# Patient Record
Sex: Male | Born: 1965 | Race: White | Hispanic: No | Marital: Married | Smoking: Never smoker
Health system: Southern US, Community
[De-identification: ages and names within clinical notes are randomized; demographics above are authoritative.]

## PROBLEM LIST (undated history)

## (undated) DIAGNOSIS — D689 Coagulation defect, unspecified: Secondary | ICD-10-CM

## (undated) DIAGNOSIS — Z86718 Personal history of other venous thrombosis and embolism: Secondary | ICD-10-CM

## (undated) HISTORY — PX: FRACTURE SURGERY: SHX138

## (undated) HISTORY — PX: LEG SURGERY: SHX1003

## (undated) HISTORY — DX: Coagulation defect, unspecified: D68.9

---

## 1999-01-13 ENCOUNTER — Encounter: Payer: Self-pay | Admitting: Oral Surgery

## 1999-01-17 ENCOUNTER — Ambulatory Visit (HOSPITAL_COMMUNITY): Admission: RE | Admit: 1999-01-17 | Discharge: 1999-01-17 | Payer: Self-pay | Admitting: Oral Surgery

## 2010-05-15 ENCOUNTER — Other Ambulatory Visit: Payer: Self-pay | Admitting: Orthopedic Surgery

## 2010-05-15 ENCOUNTER — Inpatient Hospital Stay (HOSPITAL_COMMUNITY)
Admission: EM | Admit: 2010-05-15 | Discharge: 2010-05-16 | Payer: Self-pay | Source: Home / Self Care | Attending: Internal Medicine | Admitting: Internal Medicine

## 2010-05-15 ENCOUNTER — Ambulatory Visit (HOSPITAL_COMMUNITY)
Admission: RE | Admit: 2010-05-15 | Discharge: 2010-05-15 | Payer: Self-pay | Source: Home / Self Care | Attending: Unknown Physician Specialty | Admitting: Unknown Physician Specialty

## 2010-07-24 LAB — BASIC METABOLIC PANEL
BUN: 16 mg/dL (ref 6–23)
BUN: 20 mg/dL (ref 6–23)
CO2: 28 mEq/L (ref 19–32)
CO2: 29 mEq/L (ref 19–32)
Calcium: 10 mg/dL (ref 8.4–10.5)
Calcium: 9.1 mg/dL (ref 8.4–10.5)
Chloride: 100 mEq/L (ref 96–112)
Chloride: 100 mEq/L (ref 96–112)
Creatinine, Ser: 0.98 mg/dL (ref 0.4–1.5)
Creatinine, Ser: 1.13 mg/dL (ref 0.4–1.5)
GFR calc Af Amer: >60 mL/min (ref >60)
GFR calc Af Amer: >60 mL/min (ref >60)
GFR calc non Af Amer: >60 mL/min (ref >60)
GFR calc non Af Amer: >60 mL/min (ref >60)
Glucose, Bld: 135 mg/dL — ABNORMAL HIGH (ref 70–99)
Glucose, Bld: 99 mg/dL (ref 70–99)
Potassium: 4.4 mEq/L (ref 3.5–5.1)
Potassium: 4.6 mEq/L (ref 3.5–5.1)
Sodium: 134 mEq/L — ABNORMAL LOW (ref 135–145)
Sodium: 139 mEq/L (ref 135–145)

## 2010-07-24 LAB — CBC
HCT: 38.1 % — ABNORMAL LOW (ref 39.0–52.0)
HCT: 39 % (ref 39.0–52.0)
HCT: 42.7 % (ref 39.0–52.0)
Hemoglobin: 12.5 g/dL — ABNORMAL LOW (ref 13.0–17.0)
Hemoglobin: 13 g/dL (ref 13.0–17.0)
Hemoglobin: 14 g/dL (ref 13.0–17.0)
MCH: 31.2 pg (ref 26.0–34.0)
MCH: 31.3 pg (ref 26.0–34.0)
MCH: 31.9 pg (ref 26.0–34.0)
MCHC: 32.8 g/dL (ref 30.0–36.0)
MCHC: 32.8 g/dL (ref 30.0–36.0)
MCHC: 33.3 g/dL (ref 30.0–36.0)
MCV: 95 fL (ref 78.0–100.0)
MCV: 95.5 fL (ref 78.0–100.0)
MCV: 95.6 fL (ref 78.0–100.0)
Platelets: 253 10*3/uL (ref 150–400)
Platelets: 292 10*3/uL (ref 150–400)
Platelets: 308 10*3/uL (ref 150–400)
RBC: 4.01 MIL/uL — ABNORMAL LOW (ref 4.22–5.81)
RBC: 4.08 MIL/uL — ABNORMAL LOW (ref 4.22–5.81)
RBC: 4.47 MIL/uL (ref 4.22–5.81)
RDW: 12.1 % (ref 11.5–15.5)
RDW: 12.2 % (ref 11.5–15.5)
RDW: 12.2 % (ref 11.5–15.5)
WBC: 10.4 10*3/uL (ref 4.0–10.5)
WBC: 10.8 10*3/uL — ABNORMAL HIGH (ref 4.0–10.5)
WBC: 9.3 10*3/uL (ref 4.0–10.5)

## 2010-07-24 LAB — LUPUS ANTICOAGULANT PANEL
DRVVT: 51.5 secs — ABNORMAL HIGH (ref 36.2–44.3)
Lupus Anticoagulant: NOT DETECTED
PTT Lupus Anticoagulant: 35.8 secs (ref 30.0–45.6)
dRVVT Incubated 1:1 Mix: 42.8 secs (ref 36.2–44.3)

## 2010-07-24 LAB — PROTIME-INR
INR: 0.91 (ref 0.00–1.49)
INR: 1.03 (ref 0.00–1.49)
Prothrombin Time: 12.5 seconds (ref 11.6–15.2)
Prothrombin Time: 13.7 seconds (ref 11.6–15.2)

## 2010-07-24 LAB — BETA-2-GLYCOPROTEIN I ABS, IGG/M/A
Beta-2 Glyco I IgG: 1 G Units (ref <20)
Beta-2-Glycoprotein I IgA: 7 A Units (ref <20)
Beta-2-Glycoprotein I IgM: 7 M Units (ref <20)

## 2010-07-24 LAB — PROTEIN S ACTIVITY: Protein S Activity: 125 % (ref 69–129)

## 2010-07-24 LAB — FACTOR 5 LEIDEN

## 2010-07-24 LAB — CARDIOLIPIN ANTIBODIES, IGG, IGM, IGA
Anticardiolipin IgA: 5 APL U/mL — ABNORMAL LOW (ref <22)
Anticardiolipin IgG: 6 GPL U/mL — ABNORMAL LOW (ref <23)
Anticardiolipin IgM: 4 MPL U/mL — ABNORMAL LOW (ref <11)

## 2010-07-24 LAB — PROTEIN C ACTIVITY: Protein C Activity: 117 % (ref 75–133)

## 2010-07-24 LAB — ANTITHROMBIN III: AntiThromb III Func: 113 % (ref 76–126)

## 2010-07-24 LAB — PROTEIN S, TOTAL: Protein S Ag, Total: 161 % — ABNORMAL HIGH (ref 70–140)

## 2010-07-24 LAB — HOMOCYSTEINE: Homocysteine: 10.5 umol/L (ref 4.0–15.4)

## 2010-07-24 LAB — PROTEIN C, TOTAL: Protein C, Total: 112 % (ref 70–140)

## 2010-07-24 LAB — PROTHROMBIN GENE MUTATION

## 2011-07-02 ENCOUNTER — Emergency Department (INDEPENDENT_AMBULATORY_CARE_PROVIDER_SITE_OTHER): Payer: No Typology Code available for payment source

## 2011-07-02 ENCOUNTER — Encounter (HOSPITAL_BASED_OUTPATIENT_CLINIC_OR_DEPARTMENT_OTHER): Payer: Self-pay | Admitting: *Deleted

## 2011-07-02 ENCOUNTER — Other Ambulatory Visit: Payer: Self-pay

## 2011-07-02 ENCOUNTER — Emergency Department (HOSPITAL_BASED_OUTPATIENT_CLINIC_OR_DEPARTMENT_OTHER)
Admission: EM | Admit: 2011-07-02 | Discharge: 2011-07-03 | Disposition: A | Discharge status: Home Health | Payer: No Typology Code available for payment source | Attending: Emergency Medicine | Admitting: Emergency Medicine

## 2011-07-02 DIAGNOSIS — R079 Chest pain, unspecified: Secondary | ICD-10-CM

## 2011-07-02 DIAGNOSIS — R071 Chest pain on breathing: Secondary | ICD-10-CM | POA: Insufficient documentation

## 2011-07-02 DIAGNOSIS — R0789 Other chest pain: Secondary | ICD-10-CM

## 2011-07-02 HISTORY — DX: Personal history of other venous thrombosis and embolism: Z86.718

## 2011-07-02 LAB — COMPREHENSIVE METABOLIC PANEL
ALT: 17 U/L (ref 0–53)
AST: 20 U/L (ref 0–37)
Albumin: 4.3 g/dL (ref 3.5–5.2)
Alkaline Phosphatase: 65 U/L (ref 39–117)
BUN: 20 mg/dL (ref 6–23)
CO2: 27 mEq/L (ref 19–32)
Calcium: 10.1 mg/dL (ref 8.4–10.5)
Chloride: 100 mEq/L (ref 96–112)
Creatinine, Ser: 1.2 mg/dL (ref 0.50–1.35)
GFR calc Af Amer: 82 mL/min — ABNORMAL LOW (ref >90)
GFR calc non Af Amer: 71 mL/min — ABNORMAL LOW (ref >90)
Glucose, Bld: 94 mg/dL (ref 70–99)
Potassium: 3.8 mEq/L (ref 3.5–5.1)
Sodium: 137 mEq/L (ref 135–145)
Total Bilirubin: 0.3 mg/dL (ref 0.3–1.2)
Total Protein: 7.5 g/dL (ref 6.0–8.3)

## 2011-07-02 LAB — DIFFERENTIAL
Basophils Absolute: 0.1 10*3/uL (ref 0.0–0.1)
Basophils Relative: 0 % (ref 0–1)
Eosinophils Absolute: 0.1 10*3/uL (ref 0.0–0.7)
Eosinophils Relative: 1 % (ref 0–5)
Lymphocytes Relative: 43 % (ref 12–46)
Lymphs Abs: 5.2 10*3/uL — ABNORMAL HIGH (ref 0.7–4.0)
Monocytes Absolute: 0.8 10*3/uL (ref 0.1–1.0)
Monocytes Relative: 6 % (ref 3–12)
Neutro Abs: 6.1 10*3/uL (ref 1.7–7.7)
Neutrophils Relative %: 50 % (ref 43–77)

## 2011-07-02 LAB — CBC
HCT: 43.3 % (ref 39.0–52.0)
Hemoglobin: 14.9 g/dL (ref 13.0–17.0)
MCH: 31.7 pg (ref 26.0–34.0)
MCHC: 34.4 g/dL (ref 30.0–36.0)
MCV: 92.1 fL (ref 78.0–100.0)
Platelets: 245 10*3/uL (ref 150–400)
RBC: 4.7 MIL/uL (ref 4.22–5.81)
RDW: 11.5 % (ref 11.5–15.5)
WBC: 12.3 10*3/uL — ABNORMAL HIGH (ref 4.0–10.5)

## 2011-07-02 LAB — CARDIAC PANEL(CRET KIN+CKTOT+MB+TROPI)
CK, MB: 2.1 ng/mL (ref 0.3–4.0)
Relative Index: 1.4 (ref 0.0–2.5)
Total CK: 151 U/L (ref 7–232)
Troponin I: <0.3 ng/mL (ref <0.30)

## 2011-07-02 LAB — LIPASE, BLOOD: Lipase: 43 U/L (ref 11–59)

## 2011-07-02 MED ORDER — GI COCKTAIL ~~LOC~~
30.0000 mL | Freq: Once | ORAL | Status: AC
  Administered 2011-07-02: 30 mL via ORAL
  Filled 2011-07-02: qty 30

## 2011-07-02 MED ORDER — SODIUM CHLORIDE 0.9 % IV BOLUS (SEPSIS)
1000.0000 mL | Freq: Once | INTRAVENOUS | Status: AC
  Administered 2011-07-02: 1000 mL via INTRAVENOUS

## 2011-07-02 NOTE — ED Provider Notes (Addendum)
History   This chart was scribed for Jason Browning [name], MD by Jason Browning. The patient was seen in room MH02/MH02 and the patient's care was started at 11:03PM.    CSN: [id number]  Arrival date & time 07/02/11  2225   First MD Initiated Contact with Patient 07/02/11 2301      Chief Complaint  Patient presents with  . Chest Pain    (Consider location/radiation/quality/duration/timing/severity/associated sxs/prior treatment) Patient is a 46 y.o. male presenting with chest pain. The history is provided by the patient. No language interpreter was used.  Chest Pain The chest pain began yesterday. Chest pain occurs constantly. The chest pain is unchanged. Associated with: none. At its most intense, the pain is at 3/10. The pain is currently at 2/10. The severity of the pain is mild. The quality of the pain is described as dull. The pain does not radiate. Pertinent negatives for primary symptoms include no fever, no fatigue, no syncope, no shortness of breath, no cough, no wheezing, no palpitations, no abdominal pain, no nausea, no vomiting and no altered mental status.  Pertinent negatives for associated symptoms include no claudication, no diaphoresis and no lower extremity edema. He tried nothing for the symptoms.  Pertinent negatives for past medical history include no aneurysm and no Marfan's syndrome.  Pertinent negatives for family medical history include: family history of aortic dissection and no CAD in family.  Procedure history is negative for cardiac catheterization.    Jason Browning is a 46 y.o. male who presents to the Emergency Department complaining of constant, mild to moderate, slightly burning, low-grade, non-radiating left-sided chest pain with an onset 36 hrs ago. Pt has been recently stressed with increased work hours; for 5 weeks, pt has been working 16 hrs a day. Pt has never had this type of pain before. Staying occupied alleviates the pain; food consumption does  not alleviate or aggravate the pain. No HA, neck pain, SOB, sore throat, abd pain, n/v/d, rash, extremity pain, extremity edema, or changes in vision. No allergies to medications. Hx of blood clots. No recent trauma. Non-smoker. No Hx of HTN or hypercholesteremia. No family Hx of heart problems. No other pertinent medical symptoms.  Past Medical History  Diagnosis Date  . Hx of blood clots     Past Surgical History  Procedure Date  . Leg surgery     No family history on file.  History  Substance Use Topics  . Smoking status: Never Smoker   . Smokeless tobacco: Not on file  . Alcohol Use: Yes      Review of Systems  Constitutional: Negative.  Negative for fever, diaphoresis and fatigue.  HENT: Negative for neck pain.   Eyes: Negative.   Respiratory: Negative for cough, shortness of breath, wheezing and stridor.   Cardiovascular: Positive for chest pain. Negative for palpitations, claudication and syncope.  Gastrointestinal: Negative for nausea, vomiting, abdominal pain and abdominal distention.  Genitourinary: Negative for difficulty urinating.  Musculoskeletal: Negative.   Neurological: Negative.   Hematological: Negative.   Psychiatric/Behavioral: Negative.  Negative for altered mental status.   10 Systems reviewed and are negative for acute change except as noted in the HPI.  Allergies  Review of patient's allergies indicates no known allergies.  Home Medications  No current outpatient prescriptions on file.  BP 151/89  Pulse 71  Temp(Src) 98 F (36.7 C) (Oral)  Resp 20  SpO2 100%  Physical Exam  Nursing note and vitals reviewed. Constitutional: He appears well-developed and  well-nourished.       Awake, alert, nontoxic appearance.  HENT:  Head: Normocephalic and atraumatic.  Right Ear: External ear normal.  Left Ear: External ear normal.  Mouth/Throat: Oropharynx is clear and moist.  Eyes: Conjunctivae and EOM are normal. Pupils are equal, round, and  reactive to light. Right eye exhibits no discharge. Left eye exhibits no discharge.  Neck: Normal range of motion. Neck supple. No thyromegaly present.       No thyromegaly.  Cardiovascular: Normal rate, regular rhythm, normal heart sounds and intact distal pulses.   No murmur heard.      Nml DTRs  Pulmonary/Chest: Effort normal and breath sounds normal. He has no wheezes. He exhibits no tenderness.  Abdominal: Soft. Bowel sounds are normal. He exhibits no distension. There is no tenderness. There is no rebound.  Musculoskeletal: He exhibits no edema and no tenderness.       Baseline ROM, no obvious new focal weakness.  Lymphadenopathy:    He has no cervical adenopathy.  Neurological:       Mental status and motor strength appears baseline for patient and situation.  Skin: Skin is warm. No rash noted.  Psychiatric: He has a normal mood and affect. His behavior is normal.    ED Course  Procedures (including critical care time)  DIAGNOSTIC STUDIES: Oxygen Saturation is 100% on room air, normal by my interpretation.    COORDINATION OF CARE:  11:07PM - EDMD will order a CXR  Results for orders placed during the hospital encounter of 07/02/11  CBC      Component Value Range   WBC 12.3 (*) 4.0 - 10.5 (K/uL)   RBC 4.70  4.22 - 5.81 (MIL/uL)   Hemoglobin 14.9  13.0 - 17.0 (g/dL)   HCT 62.1  30.8 - 65.7 (%)   MCV 92.1  78.0 - 100.0 (fL)   MCH 31.7  26.0 - 34.0 (pg)   MCHC 34.4  30.0 - 36.0 (g/dL)   RDW 84.6  96.2 - 95.2 (%)   Platelets 245  150 - 400 (K/uL)  DIFFERENTIAL      Component Value Range   Neutrophils Relative 50  43 - 77 (%)   Neutro Abs 6.1  1.7 - 7.7 (K/uL)   Lymphocytes Relative 43  12 - 46 (%)   Lymphs Abs 5.2 (*) 0.7 - 4.0 (K/uL)   Monocytes Relative 6  3 - 12 (%)   Monocytes Absolute 0.8  0.1 - 1.0 (K/uL)   Eosinophils Relative 1  0 - 5 (%)   Eosinophils Absolute 0.1  0.0 - 0.7 (K/uL)   Basophils Relative 0  0 - 1 (%)   Basophils Absolute 0.1  0.0 - 0.1  (K/uL)  COMPREHENSIVE METABOLIC PANEL      Component Value Range   Sodium 137  135 - 145 (mEq/L)   Potassium 3.8  3.5 - 5.1 (mEq/L)   Chloride 100  96 - 112 (mEq/L)   CO2 27  19 - 32 (mEq/L)   Glucose, Bld 94  70 - 99 (mg/dL)   BUN 20  6 - 23 (mg/dL)   Creatinine, Ser 8.41  0.50 - 1.35 (mg/dL)   Calcium 32.4  8.4 - 10.5 (mg/dL)   Total Protein 7.5  6.0 - 8.3 (g/dL)   Albumin 4.3  3.5 - 5.2 (g/dL)   AST 20  0 - 37 (U/L)   ALT 17  0 - 53 (U/L)   Alkaline Phosphatase 65  39 - 117 (U/L)  Total Bilirubin 0.3  0.3 - 1.2 (mg/dL)   GFR calc non Af Amer 71 (*) >90 (mL/min)   GFR calc Af Amer 82 (*) >90 (mL/min)  CARDIAC PANEL(CRET KIN+CKTOT+MB+TROPI)      Component Value Range   Total CK 151  7 - 232 (U/L)   CK, MB 2.1  0.3 - 4.0 (ng/mL)   Troponin I <0.30  <0.30 (ng/mL)   Relative Index 1.4  0.0 - 2.5   LIPASE, BLOOD      Component Value Range   Lipase 43  11 - 59 (U/L)      Dg Chest 2 View  07/02/2011  *RADIOLOGY REPORT*  Clinical Data: Chest pain for 1 day.  CHEST - 2 VIEW  Comparison: None  Findings: The lungs are well-aerated and clear.  There is no evidence of focal opacification, pleural effusion or pneumothorax.  The heart is normal in size; the mediastinal contour is within normal limits.  No acute osseous abnormalities are seen.  IMPRESSION: No acute cardiopulmonary process seen.  Original Report Authenticated By: Tonia Ghent, M.D.     No diagnosis found.   Date: 07/03/2011  Rate: 97  Rhythm: normal sinus rhythm  QRS Axis: normal  Intervals: normal  ST/T Wave abnormalities: normal  Conduction Disutrbances:none  Narrative Interpretation:   Old EKG Reviewed: unchanged   MDM  Return for worsening chest pain shortness of breath or any concerns.  Follow up today with your family doctor and with cardiology for stress test.  Patient verbalizes understanding and agrees to follow up    I personally performed the services described in this documentation, which was  scribed in my presence. The recorded information has been reviewed and considered.    Jasmine Awe, MD 07/03/11 [phone]

## 2011-07-02 NOTE — ED Notes (Signed)
Chest pain since yesterday. Feels like heart burn and pressure. Lightheaded and at times feels like he is going to pass out.

## 2011-07-03 LAB — CARDIAC PANEL(CRET KIN+CKTOT+MB+TROPI)
Relative Index: 1.7 (ref 0.0–2.5)
Troponin I: <0.3 ng/mL (ref <0.30)

## 2011-07-03 MED ORDER — IBUPROFEN 600 MG PO TABS: 600.0000 mg | ORAL_TABLET | Freq: Four times a day (QID) | ORAL | Status: AC | PRN

## 2011-07-03 MED ORDER — KETOROLAC TROMETHAMINE 30 MG/ML IJ SOLN
30.0000 mg | Freq: Once | INTRAMUSCULAR | Status: AC
  Administered 2011-07-03: 30 mg via INTRAVENOUS
  Filled 2011-07-03: qty 1

## 2011-07-03 NOTE — Discharge Instructions (Signed)
Chest Wall Pain Chest wall pain is pain in or around the bones and muscles of your chest. This may occur:   On its own (spontaneously).   After a viral illness such as the flu.   Through injur.   From coughing.   Minor exercise.  It may take up to 6 weeks to get better; longer if you must stay physically active in your work and activities. HOME CARE INSTRUCTIONS   Avoid over-tiring physical activity. Try not to strain or perform activities which cause pain. This would include any activities using chest, belly (abdominal) and side muscles, especially if heavy weights are used.   Use ice on the painful area for 15 to 20 minutes per hour while awake for the first 2 days. Place the ice in a plastic bag and place a towel between the bag of ice and your skin.   Only take over-the-counter or prescription medicines for pain, discomfort, or fever as directed by your caregiver.  SEEK IMMEDIATE MEDICAL CARE IF:   Your pain increases or you are very uncomfortable.   An oral temperature above 102 F (38.9 C)develops.   Your chest pains become worse.   You develop new, unexplained problems (symptoms).   You develop nausea, vomiting, sweating or feel light headed.   You develop a cough which produces phlegm (sputum) or you cough up blood.  MAKE SURE YOU:   Understand these instructions.   Will watch your condition.   Will get help right away if you are not doing well or get worse.  Document Released: 04/30/2005 Document Revised: 11/13/2010 Document Reviewed: 12/17/2007 [employer] Patient Information 2012 [employer]. 

## 2012-06-21 ENCOUNTER — Ambulatory Visit (INDEPENDENT_AMBULATORY_CARE_PROVIDER_SITE_OTHER): Payer: PRIVATE HEALTH INSURANCE | Admitting: Family Medicine

## 2012-06-21 VITALS — BP 125/71 | HR 80 | Temp 99.5°F | Resp 16 | Ht 69.0 in | Wt 198.0 lb

## 2012-06-21 DIAGNOSIS — H6092 Unspecified otitis externa, left ear: Secondary | ICD-10-CM

## 2012-06-21 DIAGNOSIS — H60399 Other infective otitis externa, unspecified ear: Secondary | ICD-10-CM

## 2012-06-21 MED ORDER — CIPROFLOXACIN-DEXAMETHASONE 0.3-0.1 % OT SUSP: 4.0000 [drp] | Freq: Two times a day (BID) | OTIC | Status: DC

## 2012-06-21 MED ORDER — ANTIPYRINE-BENZOCAINE 5.4-1.4 % OT SOLN: 3.0000 [drp] | OTIC | Status: DC | PRN

## 2012-06-21 NOTE — Patient Instructions (Addendum)
Hot showers or breathing in steam may help decrease your eustachian tube swelling.  I recommend augmenting with 12 hr sudafed (behind the counter) and/or cetrizine (generic zyrtec) to open up your eustachian tube and allow your right ear to drain. Use nasal saline spray frequently throughout the day. If no improvement or you are getting worse, come back as you might need a course of steroids but hopefully with all of the above, you can avoid it.  Otitis Externa Otitis externa is a bacterial or fungal infection of the outer ear canal. This is the area from the eardrum to the outside of the ear. Otitis externa is sometimes called "swimmer's ear." CAUSES  Possible causes of infection include:  Swimming in dirty water.  Moisture remaining in the ear after swimming or bathing.  Mild injury (trauma) to the ear.  Objects stuck in the ear (foreign body).  Cuts or scrapes (abrasions) on the outside of the ear. SYMPTOMS  The first symptom of infection is often itching in the ear canal. Later signs and symptoms may include swelling and redness of the ear canal, ear pain, and yellowish-white fluid (pus) coming from the ear. The ear pain may be worse when pulling on the earlobe. DIAGNOSIS  Your caregiver will perform a physical exam. A sample of fluid may be taken from the ear and examined for bacteria or fungi. TREATMENT  Antibiotic ear drops are often given for 10 to 14 days. Treatment may also include pain medicine or corticosteroids to reduce itching and swelling. PREVENTION   Keep your ear dry. Use the corner of a towel to absorb water out of the ear canal after swimming or bathing.  Avoid scratching or putting objects inside your ear. This can damage the ear canal or remove the protective wax that lines the canal. This makes it easier for bacteria and fungi to grow.  Avoid swimming in lakes, polluted water, or poorly chlorinated pools.  You may use ear drops made of rubbing alcohol and vinegar  after swimming. Combine equal parts of white vinegar and alcohol in a bottle. Put 3 or 4 drops into each ear after swimming. HOME CARE INSTRUCTIONS   Apply antibiotic ear drops to the ear canal as prescribed by your caregiver.  Only take over-the-counter or prescription medicines for pain, discomfort, or fever as directed by your caregiver.  If you have diabetes, follow any additional treatment instructions from your caregiver.  Keep all follow-up appointments as directed by your caregiver. SEEK MEDICAL CARE IF:   You have a fever.  Your ear is still red, swollen, painful, or draining pus after 3 days.  Your redness, swelling, or pain gets worse.  You have a severe headache.  You have redness, swelling, pain, or tenderness in the area behind your ear. MAKE SURE YOU:   Understand these instructions.  Will watch your condition.  Will get help right away if you are not doing well or get worse. Document Released: 04/30/2005 Document Revised: 07/23/2011 Document Reviewed: 05/17/2011 [facility] Patient Information 2013 [place], [place].

## 2012-06-21 NOTE — Progress Notes (Signed)
Subjective:    Patient ID: Jason Browning, male    DOB: December 06, 1965, 47 y.o.   MRN: [id number]   Chief Complaint  Patient presents with  . Otalgia    Left ear     HPI  Severe left ear pain started yesterday with decreased hearing. No drainage. He had to take 3 percocets this a.m. with only some relief - still hurt though.  No tinnitus. No f/c.  Does not clean out ears w/ qtips but sometimes uses random objects - like paperclips to get wax out.  Past Medical History  Diagnosis Date  . Hx of blood clots    No current outpatient prescriptions on file prior to visit.   No current facility-administered medications on file prior to visit.   No Known Allergies Past Surgical History  Procedure Laterality Date  . Leg surgery       Review of Systems  Constitutional: Positive for fatigue. Negative for fever, chills, diaphoresis, activity change, appetite change and unexpected weight change.  HENT: Positive for hearing loss, ear pain and rhinorrhea. Negative for nosebleeds, congestion, sore throat, mouth sores, neck pain, neck stiffness, postnasal drip, sinus pressure, tinnitus and ear discharge.   Respiratory: Negative for cough and shortness of breath.   Cardiovascular: Negative for chest pain.  Gastrointestinal: Negative for nausea, vomiting, abdominal pain, diarrhea and constipation.  Genitourinary: Negative for dysuria.  Musculoskeletal: Negative for myalgias and arthralgias.  Skin: Negative for rash.  Neurological: Positive for headaches. Negative for syncope.  Hematological: Negative for adenopathy.  Psychiatric/Behavioral: Positive for sleep disturbance.      BP 125/71  Pulse 80  Temp(Src) 99.5 F (37.5 C)  Resp 16  Ht 5\' 9"  (1.753 m)  Wt 198 lb (89.812 kg)  BMI 29.23 kg/m2  SpO2 98% Objective:   Physical Exam  Constitutional: He is oriented to person, place, and time. He appears well-developed and well-nourished. No distress.  HENT:  Head: Normocephalic and  atraumatic.  Right Ear: External ear and ear canal normal. Tympanic membrane is retracted. A middle ear effusion is present.  Left Ear: There is drainage and tenderness. Decreased hearing is noted.  Nose: Mucosal edema and rhinorrhea present. Right sinus exhibits no maxillary sinus tenderness and no frontal sinus tenderness. Left sinus exhibits no maxillary sinus tenderness and no frontal sinus tenderness.  Mouth/Throat: Uvula is midline and mucous membranes are normal. No oropharyngeal exudate, posterior oropharyngeal edema or posterior oropharyngeal erythema.  Copious white purulent material obstructing left canal, unable to visualize TM.  Eyes: Conjunctivae are normal. Right eye exhibits no discharge. Left eye exhibits no discharge. No scleral icterus.  Neck: Normal range of motion. Neck supple. No thyromegaly present.  Cardiovascular: Normal rate, regular rhythm, normal heart sounds and intact distal pulses.   Pulmonary/Chest: Effort normal and breath sounds normal. No respiratory distress.  Lymphadenopathy:       Head (right side): No submandibular, no tonsillar, no preauricular and no posterior auricular adenopathy present.       Head (left side): No submandibular, no tonsillar, no preauricular and no posterior auricular adenopathy present.    He has no cervical adenopathy.       Right cervical: No superficial cervical and no posterior cervical adenopathy present.      Left cervical: No superficial cervical and no posterior cervical adenopathy present.       Right: No supraclavicular adenopathy present.       Left: No supraclavicular adenopathy present.  Neurological: He is alert and oriented to person,  place, and time.  Skin: Skin is warm and dry. He is not diaphoretic. No erythema.  Psychiatric: He has a normal mood and affect. His behavior is normal.      Assessment & Plan:   1. Otitis externa of left ear  ciprofloxacin-dexamethasone (CIPRODEX) otic suspension    ciprofloxacin-dexamethasone (CIPRODEX) otic suspension   antipyrine-benzocaine (AURALGAN) otic solution   Meds ordered this encounter  Medications  . ciprofloxacin-dexamethasone (CIPRODEX) otic suspension    Sig: Place 4 drops into the left ear 2 (two) times daily.    Dispense:  7.5 mL    Refill:  0  . antipyrine-benzocaine (AURALGAN) otic solution    Sig: Place 3 drops into the left ear every 2 (two) hours as needed for pain.    Dispense:  10 mL    Refill:  0  RTC in 2d for recheck to ensure resolving and that pt doesn't need ear wick placed or that pt does not have a TM infection or perf since TM cannot be visualized upon exam today  Hot showers or breathing in steam may help decrease your eustachian tube swelling.  I recommend augmenting with 12 hr sudafed (behind the counter) and/or cetrizine (generic zyrtec) to open up your eustachian tube and allow your right ear to drain. Use nasal saline spray frequently throughout the day. If no improvement or you are getting worse, come back as you might need a course of steroids but hopefully with all of the above, you can avoid it.

## 2013-10-23 ENCOUNTER — Emergency Department (HOSPITAL_BASED_OUTPATIENT_CLINIC_OR_DEPARTMENT_OTHER)
Admission: EM | Admit: 2013-10-23 | Discharge: 2013-10-24 | Disposition: A | Discharge status: Home / Self Care | Payer: 59 | Attending: Emergency Medicine | Admitting: Emergency Medicine

## 2013-10-23 ENCOUNTER — Encounter (HOSPITAL_BASED_OUTPATIENT_CLINIC_OR_DEPARTMENT_OTHER): Payer: Self-pay | Admitting: Emergency Medicine

## 2013-10-23 DIAGNOSIS — S01501A Unspecified open wound of lip, initial encounter: Secondary | ICD-10-CM | POA: Insufficient documentation

## 2013-10-23 DIAGNOSIS — Z86718 Personal history of other venous thrombosis and embolism: Secondary | ICD-10-CM | POA: Insufficient documentation

## 2013-10-23 DIAGNOSIS — W5581XA Bitten by other mammals, initial encounter: Secondary | ICD-10-CM

## 2013-10-23 DIAGNOSIS — S0120XA Unspecified open wound of nose, initial encounter: Secondary | ICD-10-CM | POA: Insufficient documentation

## 2013-10-23 DIAGNOSIS — S0181XA Laceration without foreign body of other part of head, initial encounter: Secondary | ICD-10-CM

## 2013-10-23 DIAGNOSIS — Y929 Unspecified place or not applicable: Secondary | ICD-10-CM | POA: Insufficient documentation

## 2013-10-23 DIAGNOSIS — S0185XA Open bite of other part of head, initial encounter: Secondary | ICD-10-CM

## 2013-10-23 DIAGNOSIS — Y939 Activity, unspecified: Secondary | ICD-10-CM | POA: Insufficient documentation

## 2013-10-23 DIAGNOSIS — Z23 Encounter for immunization: Secondary | ICD-10-CM | POA: Insufficient documentation

## 2013-10-23 DIAGNOSIS — W540XXA Bitten by dog, initial encounter: Secondary | ICD-10-CM

## 2013-10-23 DIAGNOSIS — T148 Other injury of unspecified body region: Secondary | ICD-10-CM | POA: Insufficient documentation

## 2013-10-23 MED ORDER — AMOXICILLIN-POT CLAVULANATE 875-125 MG PO TABS
1.0000 | ORAL_TABLET | Freq: Once | ORAL | Status: AC
  Administered 2013-10-23: 1 via ORAL
  Filled 2013-10-23: qty 1

## 2013-10-23 MED ORDER — TETANUS-DIPHTH-ACELL PERTUSSIS 5-2.5-18.5 LF-MCG/0.5 IM SUSP
0.5000 mL | Freq: Once | INTRAMUSCULAR | Status: AC
  Administered 2013-10-23: 0.5 mL via INTRAMUSCULAR
  Filled 2013-10-23: qty 0.5

## 2013-10-23 NOTE — ED Notes (Signed)
Pt's dog bit him in the face-nose and lip injury

## 2013-10-23 NOTE — ED Provider Notes (Signed)
CSN: [id number]     Arrival date & time 10/23/13  2236 History  This chart was scribed for Audriella Blakeley Smitty CordsK Kala Gassmann-Rasch, MD by Quintella ReichertMatthew Underwood, ED scribe.  This patient was seen in room MHT14/MHT14 and the patient's care was started at 11:30 PM.   Chief Complaint  Patient presents with  . Animal Bite    Patient is a 48 y.o. male presenting with animal bite. The history is provided by the patient. No language interpreter was used.  Animal Bite Contact animal:  Dog Location:  Face (face) Facial injury location:  Face, nose and upper lip Time since incident: today. Pain details:    Quality:  Aching   Progression:  Unchanged Incident location:  Home Provoked: unprovoked   Notifications:  None Animal's rabies vaccination status:  Up to date Animal in possession: yes   Tetanus status:  Unknown Relieved by:  Nothing Worsened by:  Nothing tried Ineffective treatments:  None tried Associated symptoms: no fever     HPI Comments: Jason Browning is a 48 y.o. male who presents to the Emergency Department complaining of a dog bite to the face sustained pta.  Pt states his dog bite him in the nose and lip.  The dog's vaccinations are UTD.  Tetanus status is unknown.   Past Medical History  Diagnosis Date  . Hx of blood clots     Past Surgical History  Procedure Laterality Date  . Leg surgery      No family history on file.   History  Substance Use Topics  . Smoking status: Never Smoker   . Smokeless tobacco: Not on file  . Alcohol Use: Yes     Review of Systems  Constitutional: Negative for fever.  Skin: Positive for wound.  All other systems reviewed and are negative.     Allergies  Review of patient's allergies indicates no known allergies.  Home Medications   Prior to Admission medications   Medication Sig Start Date End Date Taking? Authorizing Provider  antipyrine-benzocaine Lyla Son(AURALGAN) otic solution Place 3 drops into the left ear every 2 (two) hours as needed  for pain. 06/21/12   Sherren MochaEva N Shaw, MD  ciprofloxacin-dexamethasone [facility](CIPRODEX) otic suspension Place 4 drops into the left ear 2 (two) times daily. 06/21/12   Sherren MochaEva N Shaw, MD   BP 153/90  Pulse 90  Temp(Src) 98.7 F (37.1 C) (Oral)  Resp 18  Ht 5\' 9"  (1.753 m)  Wt 185 lb (83.915 kg)  BMI 27.31 kg/m2  SpO2 99%  Physical Exam  Nursing note and vitals reviewed. Constitutional: He is oriented to person, place, and time. He appears well-developed and well-nourished. No distress.  HENT:  Head: Normocephalic and atraumatic.    Laceration through the left nasal ala, missing tissue in the center.  Scratch on the bridge of the nose.  Bite mark to inside of upper lip, size of human incisor. Jagged bite mark to lateral philtrum through vermilion border. No septal hematoma.  Eyes: Conjunctivae and EOM are normal. Pupils are equal, round, and reactive to light.  Neck: Normal range of motion. No tracheal deviation present.  Cardiovascular: Normal rate and regular rhythm.   Pulmonary/Chest: Effort normal and breath sounds normal. No respiratory distress. He has no wheezes. He has no rales.  Abdominal: Soft. Bowel sounds are normal. There is no tenderness. There is no rebound and no guarding.  Musculoskeletal: Normal range of motion.  Neurological: He is alert and oriented to person, place, and time.  Skin: Skin  is warm and dry.  Psychiatric: He has a normal mood and affect. His behavior is normal.    ED Course  Procedures (including critical care time)  DIAGNOSTIC STUDIES: Oxygen Saturation is 99% on room air, normal by my interpretation.    COORDINATION OF CARE: 11:36 PM-Discussed treatment plan which includes consult to maxillofacial with pt at bedside and pt agreed to plan.     Labs Review Labs Reviewed - No data to display  Imaging Review No results found.   EKG Interpretation None      MDM   Final diagnoses:  None   LACERATION REPAIR Performed by: Jasmine AwePALUMBO-RASCH,Kaleeyah Cuffie  K Authorized by: Jasmine AwePALUMBO-RASCH,Yashua Bracco K Consent: Verbal consent obtained. Risks and benefits: risks, benefits and alternatives were discussed Consent given by: patient Patient identity confirmed: provided demographic data Prepped and Draped in normal sterile fashion Wound explored  Laceration Location: upper lip onto philtrum  Laceration Length: 2 cm  No Foreign Bodies seen or palpated  Anesthesia: local infiltration  Local anesthetic: lidocaine 2%   Anesthetic total: 3 ml  Irrigation method: syringe Amount of cleaning: extensive  Skin closure: 6.0 ethilon  Number of sutures: 4  Technique: interrupted  Patient tolerance: Patient tolerated the procedure well with no immediate complications.   LACERATION REPAIR Performed by: Jasmine AwePALUMBO-RASCH,Jocabed Cheese K Authorized by: Jasmine AwePALUMBO-RASCH,Eulia Hatcher K Consent: Verbal consent obtained. Risks and benefits: risks, benefits and alternatives were discussed Consent given by: patient Patient identity confirmed: provided demographic data Prepped and Draped in normal sterile fashion Wound explored  Laceration Location: left ala of the nose  Laceration Length: 2.5 cm stellate with central loss through and through  No Foreign Bodies seen or palpated  Anesthesia: local infiltration  Local anesthetic: lidocaine 2%   Anesthetic total: 5 ml  Irrigation method: syringe Amount of cleaning: standard  Skin closure: ethilon 6   Number of sutures:  5   Technique: interrupted   Patient tolerance: Patient tolerated the procedure well with no immediate complications.   Originally case d/w Dr. Jeanice Limurham close in the ED and start 10 days of augmentin  Second call: patient unable to tolerate procedure as not numb with maximum lidocaine dose confirmed with pharmacy transfer to [facility] to be seen by Dr. Jeanice Limurham    I personally performed the services described in this documentation, which was scribed in my presence. The recorded information has been  reviewed and is accurate.    Jasmine AweApril K Rooney Swails-Rasch, MD 10/24/13 [phone]

## 2013-10-24 ENCOUNTER — Encounter (HOSPITAL_COMMUNITY): Payer: Self-pay | Admitting: Emergency Medicine

## 2013-10-24 MED ORDER — LIDOCAINE HCL 2 % EX GEL
CUTANEOUS | Status: AC
  Filled 2013-10-24: qty 20

## 2013-10-24 MED ORDER — AMOXICILLIN-POT CLAVULANATE 875-125 MG PO TABS: 1.0000 | ORAL_TABLET | Freq: Two times a day (BID) | ORAL | Status: DC

## 2013-10-24 MED ORDER — BACITRACIN ZINC 500 UNIT/GM EX OINT
TOPICAL_OINTMENT | Freq: Three times a day (TID) | CUTANEOUS | Status: DC
  Administered 2013-10-24: 1 via TOPICAL

## 2013-10-24 MED ORDER — FENTANYL CITRATE 0.05 MG/ML IJ SOLN
INTRAMUSCULAR | Status: AC
  Administered 2013-10-24: 100 ug
  Filled 2013-10-24: qty 2

## 2013-10-24 MED ORDER — HYDROCODONE-ACETAMINOPHEN 5-325 MG PO TABS: 1.0000 | ORAL_TABLET | ORAL | Status: DC | PRN

## 2013-10-24 MED ORDER — OXYCODONE-ACETAMINOPHEN 5-325 MG PO TABS: 1.0000 | ORAL_TABLET | Freq: Four times a day (QID) | ORAL | Status: DC | PRN

## 2013-10-24 NOTE — ED Notes (Signed)
Dr Jeanice Limurham paged by [employer] operational officersecretary

## 2013-10-24 NOTE — Consult Note (Signed)
Reason for Consult: Dog bite to the face Referring Physician: Dr. April Paluombo  Jason PalmsRobert D Browning is an 48 y.o. male.  HPI: Mr. Jason Browning was bitten by his dog at home last night.  He sustained a laceration to his left upper lip and through the left nares of his nose at the alar base.  There was tissue loss.  The patient was seen at [facility], and the lip laceration and part of the laceration was closed by the ER Physician.  The patient received the maximum amount of lidocaine without epinephrine, but could not tolerate the procedure being completed.  The patient [facility] for closure of the remaining laceration to the left nose.   PMHx:  Past Medical History  Diagnosis Date  . Hx of blood clots     PSx:  Past Surgical History  Procedure Laterality Date  . Leg surgery      Family Hx: No family history on file.  Social Hx:  reports that he has never smoked. He does not have any smokeless tobacco history on file. He reports that he drinks alcohol. He reports that he does not use illicit drugs.  Allergies: No Known Allergies  Medications: I have reviewed the patient's current medications.  Labs: No results found for this or any previous visit (from the past 48 hour(s)).  Radiology: No results found.  ZOX:[id number]:Pertinent items are noted in HPI.  Vital Signs: BP 102/87  Pulse 74  Temp(Src) 97.7 F (36.5 C) (Oral)  Resp 18  Ht 5\' 9"  (1.753 m)  Wt 83.915 kg (185 lb)  BMI 27.31 kg/m2  SpO2 96%  The patient's occlusion is stable and repeatable.  There are no step-offs or deformities.  His cranial nerves V and VII are intact.  The left upper lip has a through-and-through laceration which was closed completely by the ED [facility] with 6-0 Nylon.  The left nose has a complex 3 cm laceration through-and-through the alar base with tissue avulsed.  There is a large portion of the lower lateral cartilage missing.  There are some 6.0 Nylon sutures  placed but the wound is not closed completely.  There is no evidence of septal hematoma.  There is a small abrasion at the bridge of the nose that requires no treatment.  Assessment/Plan: Mr. Jason Browning is s/p dog bite to the face with two lacerations to the face.  There is a laceration to the upper lip which was closed by the Emergency Department.  There is a complex 3 cm laceration which is through-and-through left alar base including the lower lateral cartilage.  1. Local was placed: 9 mL of 2% Lidocaine with 1:100:000 epinephrine   And 5 mL of 0.5% Marcaine with1:200:000  epinephrine at the left infraorbital nerve and superficially just lateral to the left nares 2. The wound was irrigated with normal saline 3. Removed the 6-0 Nylon sutures at the alar base.  Then placed 4-0 vicryl sutures deep into the remaining cartilage.  Then closed the skin with 5-0 Prolene. Placed Nu-gauze in the left nostril to help maintain a patent nares.     4. There is both skin and cartilage missing.  There will be a small  7 mm x 7 mm area without skin which will heal by secondary intention.  5. The patient will likely also need a cartilage graft to the region to rebuild the lower lateral cartilage on the left.  This can be performed with a  post-traumatic rhinoplasty after healing has been complete.  I will refer the patient to a surgeon that specializes in rhinoplasty.   6. The patient will follow up for removal of Nu-gauze on Monday in my office and the following Monday for suture removal.    7. I recommend Augmentin 875 bid x 10 days for the dog bite, bacitracin topically 3 times daily, and then pain medicine per the ER.    [place],Yitzel Shasteen L  10/24/2013, 8:21 AM

## 2013-10-24 NOTE — ED Notes (Signed)
Called [facility] for maxillofacial

## 2013-10-24 NOTE — ED Notes (Signed)
Patient tolerated dressing and application of bacitracin without incident. Pain 2/10 achy pain.

## 2013-10-24 NOTE — ED Notes (Addendum)
[facility]. Pt states that he is sent here to have sutures placed under general anesthesia because the numbing medicine did not work. Dr Jeanice Limurham is supposed to do the surgery.

## 2013-10-24 NOTE — Discharge Instructions (Signed)
Follow-up with Dr. Jeanice Limurham on Monday.  Call office at 8am for appt time. Take prescribed medications as directed.  vicodin may make you drowsy so do not drive or operate machinery while taking this. Return to the ED for new concerns.

## 2013-10-24 NOTE — ED Provider Notes (Signed)
48 y.o. M received in transfer for [facility] after being bitten in the face by his doberman. Tetanus was updated prior to transfer.  Pt awaiting plastic surgeon evaluation for wound repair as prior attempts unsuccessful after maximum dosing of lidocaine given.  10:03 AM Facial lacerations were repaired by maxillofacial surgeon, Dr. Jeanice Limurham.  Pt will be started on augmentin and vicodin for pain.  He will FU in office with Dr. Jeanice Limurham on Monday.  Discussed plan with patient, he/she acknowledged understanding and agreed with plan of care.  Return precautions given for new or worsening symptoms.   Garlon HatchetLisa M Tajee Savant, PA-C 10/24/13 1413

## 2013-10-25 NOTE — ED Provider Notes (Signed)
Medical screening examination/treatment/procedure(s) were performed by non-physician practitioner and as supervising physician I was immediately available for consultation/collaboration.   EKG Interpretation None        Nykole Matos T Breanda Greenlaw, MD 10/25/13 0916 

## 2016-12-12 ENCOUNTER — Ambulatory Visit (INDEPENDENT_AMBULATORY_CARE_PROVIDER_SITE_OTHER): Payer: 59 | Admitting: Physician Assistant

## 2016-12-12 ENCOUNTER — Encounter: Payer: Self-pay | Admitting: Physician Assistant

## 2016-12-12 VITALS — BP 136/88 | HR 68 | Temp 98.1°F | Resp 16 | Ht 68.0 in | Wt 180.8 lb

## 2016-12-12 DIAGNOSIS — R4584 Anhedonia: Secondary | ICD-10-CM | POA: Diagnosis not present

## 2016-12-12 DIAGNOSIS — R5383 Other fatigue: Secondary | ICD-10-CM

## 2016-12-12 LAB — CBC WITH DIFFERENTIAL/PLATELET
Basophils Absolute: 0.1 10*3/uL (ref 0.0–0.1)
Basophils Relative: 0.7 % (ref 0.0–3.0)
EOS PCT: 1.5 % (ref 0.0–5.0)
Eosinophils Absolute: 0.1 10*3/uL (ref 0.0–0.7)
HEMATOCRIT: 42.6 % (ref 39.0–52.0)
Hemoglobin: 14 g/dL (ref 13.0–17.0)
LYMPHS PCT: 33.3 % (ref 12.0–46.0)
Lymphs Abs: 2.4 10*3/uL (ref 0.7–4.0)
MCHC: 32.8 g/dL (ref 30.0–36.0)
MCV: 98.1 fl (ref 78.0–100.0)
MONOS PCT: 7.3 % (ref 3.0–12.0)
Monocytes Absolute: 0.5 10*3/uL (ref 0.1–1.0)
NEUTROS PCT: 57.2 % (ref 43.0–77.0)
Neutro Abs: 4.1 10*3/uL (ref 1.4–7.7)
PLATELETS: 216 10*3/uL (ref 150.0–400.0)
RBC: 4.35 Mil/uL (ref 4.22–5.81)
RDW: 11.8 % (ref 11.5–15.5)
WBC: 7.2 10*3/uL (ref 4.0–10.5)

## 2016-12-12 LAB — COMPREHENSIVE METABOLIC PANEL
ALT: 17 U/L (ref 0–53)
AST: 18 U/L (ref 0–37)
Albumin: 4.3 g/dL (ref 3.5–5.2)
Alkaline Phosphatase: 47 U/L (ref 39–117)
BILIRUBIN TOTAL: 0.5 mg/dL (ref 0.2–1.2)
BUN: 16 mg/dL (ref 6–23)
CO2: 33 meq/L — AB (ref 19–32)
CREATININE: 0.96 mg/dL (ref 0.40–1.50)
Calcium: 9.3 mg/dL (ref 8.4–10.5)
Chloride: 102 mEq/L (ref 96–112)
GFR: 87.55 mL/min (ref >60.00)
GLUCOSE: 83 mg/dL (ref 70–99)
Potassium: 4.5 mEq/L (ref 3.5–5.1)
Sodium: 137 mEq/L (ref 135–145)
Total Protein: 6.6 g/dL (ref 6.0–8.3)

## 2016-12-12 LAB — TESTOSTERONE: Testosterone: 391.56 ng/dL (ref 300.00–890.00)

## 2016-12-12 LAB — TSH: TSH: 2.12 u[IU]/mL (ref 0.35–4.50)

## 2016-12-12 LAB — VITAMIN D 25 HYDROXY (VIT D DEFICIENCY, FRACTURES): VITD: 22.76 ng/mL — ABNORMAL LOW (ref 30.00–100.00)

## 2016-12-12 LAB — VITAMIN B12: Vitamin B-12: 649 pg/mL (ref 211–911)

## 2016-12-12 NOTE — Progress Notes (Signed)
Patient presents to clinic today to establish care.  Acute Concerns: Patient endorses 1.5 weeks of decreased concentration/focus and anhedonia. Denies true fatigue but notes some mental sluggishness. Also notes increased irritability at work. Denies decreased mood. Denies suicidal thought or ideation. Endorses former history of anxiety treated with Paxil but he did not like. Denies change in sleep. Denies change in appetite. Denies recent changes in diet. Has stopped exercising in the past couple of weeks due to lack of motivation. Did stop using smokeless tobacco about 1.5 months ago. Patient denies personal or family history of thyroid disorder. Has noted cold intolerance. Denies constipation or dry skin. Denies history of anemia or vitamin deficiency.   Health Maintenance: Immunizations -- Tetanus up-to-date.   Past Medical History:  Diagnosis Date  . Hx of blood clots    after surgical procedure. no other issues.     Past Surgical History:  Procedure Laterality Date  . LEG SURGERY Left    broken femur, blood clot after surgery    No current outpatient prescriptions on file prior to visit.   No current facility-administered medications on file prior to visit.    No Known Allergies  Family History  Problem Relation Age of Onset  . Ehlers-Danlos syndrome Daughter   . Lung cancer Maternal Aunt 53       + extensive smoking history    Social History   Social History  . Marital status: Married    Spouse name: N/A  . Number of children: N/A  . Years of education: N/A   Occupational History  . Not on file.   Social History Main Topics  . Smoking status: Never Smoker  . Smokeless tobacco: Former Systems developer    Quit date: 10/31/2016  . Alcohol use Yes     Comment: rarely  . Drug use: No  . Sexual activity: Not on file   Other Topics Concern  . Not on file   Social History Narrative  . No narrative on file   Review of Systems  Constitutional: Negative for fever and  weight loss.  HENT: Negative for ear discharge, ear pain, hearing loss and tinnitus.   Eyes: Negative for blurred vision, double vision, photophobia and pain.  Respiratory: Negative for cough and shortness of breath.   Cardiovascular: Negative for chest pain and palpitations.  Gastrointestinal: Negative for abdominal pain, blood in stool, constipation, diarrhea, heartburn, melena, nausea and vomiting.  Genitourinary: Negative for dysuria, flank pain, frequency, hematuria and urgency.  Musculoskeletal: Negative for falls.  Neurological: Negative for dizziness, loss of consciousness and headaches.  Endo/Heme/Allergies: Negative for environmental allergies.  Psychiatric/Behavioral: Negative for depression, hallucinations, substance abuse and suicidal ideas. The patient is not nervous/anxious and does not have insomnia.        + anhedonia + inattention   BP 136/88 (BP Location: Left Arm, Cuff Size: Large)   Pulse 68   Temp 98.1 F (36.7 C) (Oral)   Resp 16   Ht '5\' 8"'$  (1.727 m)   Wt 180 lb 12.8 oz (82 kg)   SpO2 98%   BMI 27.49 kg/m   Physical Exam  Constitutional: He is oriented to person, place, and time and well-developed, well-nourished, and in no distress.  HENT:  Head: Normocephalic and atraumatic.  Eyes: Conjunctivae are normal.  Neck: Neck supple.  Cardiovascular: Normal rate, regular rhythm, normal heart sounds and intact distal pulses.   Pulmonary/Chest: Effort normal and breath sounds normal. No respiratory distress. He has no wheezes. He has no  rales. He exhibits no tenderness.  Neurological: He is alert and oriented to person, place, and time.  Skin: Skin is warm and dry. No rash noted.  Psychiatric: Affect normal.  Vitals reviewed.  Assessment/Plan: 1. Anhedonia 2. Other fatigue Suspect mild depression especially giving PHQ 9 score but patient denies change in sleep, guilt, appetite, etc. Will get labs today (noted below). Supportive measures discussed. Will start  treatment based on findings. Patient to schedule CPE.  - CBC w/Diff - Comp Met (CMET) - TSH - Vitamin D (25 hydroxy) - B12 - Testosterone   Leeanne Rio, PA-C

## 2016-12-12 NOTE — Patient Instructions (Signed)
Please go to the lab for blood work. I will call you with your results.  We will treat based on findings. If all labs are unremarkable would recommend treatment for mild depression.   Please keep a well-balanced diet. Stay well-hydrated and get plenty of rest. Work on increasing aerobic exercise as this can help with mood.   We will schedule follow-up when we call with your results.

## 2016-12-14 ENCOUNTER — Other Ambulatory Visit: Payer: Self-pay | Admitting: Physician Assistant

## 2016-12-14 ENCOUNTER — Telehealth: Payer: Self-pay | Admitting: General Practice

## 2016-12-14 MED ORDER — VITAMIN D (ERGOCALCIFEROL) 1.25 MG (50000 UNIT) PO CAPS: 50000.0000 [IU] | ORAL_CAPSULE | ORAL | 0 refills | Status: DC

## 2016-12-14 NOTE — Telephone Encounter (Signed)
FYI, spoke with pt and updated his cell phone number in the chart. Pt is agreeable to vitamin d prescription (fillled to local pharmacy today) however, he is not interested in counseling at this time.

## 2017-02-25 ENCOUNTER — Other Ambulatory Visit: Payer: Self-pay | Admitting: Physician Assistant

## 2017-05-21 ENCOUNTER — Other Ambulatory Visit: Payer: Self-pay

## 2017-05-21 ENCOUNTER — Encounter: Payer: Self-pay | Admitting: Physician Assistant

## 2017-05-21 ENCOUNTER — Ambulatory Visit (INDEPENDENT_AMBULATORY_CARE_PROVIDER_SITE_OTHER): Payer: 59 | Admitting: Physician Assistant

## 2017-05-21 VITALS — BP 128/84 | HR 67 | Temp 98.1°F | Resp 14 | Ht 68.0 in | Wt 195.0 lb

## 2017-05-21 DIAGNOSIS — Z125 Encounter for screening for malignant neoplasm of prostate: Secondary | ICD-10-CM

## 2017-05-21 DIAGNOSIS — Z0000 Encounter for general adult medical examination without abnormal findings: Secondary | ICD-10-CM | POA: Diagnosis not present

## 2017-05-21 DIAGNOSIS — Z1211 Encounter for screening for malignant neoplasm of colon: Secondary | ICD-10-CM | POA: Diagnosis not present

## 2017-05-21 LAB — CBC WITH DIFFERENTIAL/PLATELET
BASOS PCT: 0.7 % (ref 0.0–3.0)
Basophils Absolute: 0.1 10*3/uL (ref 0.0–0.1)
EOS ABS: 0.1 10*3/uL (ref 0.0–0.7)
Eosinophils Relative: 0.7 % (ref 0.0–5.0)
HEMATOCRIT: 42.2 % (ref 39.0–52.0)
Hemoglobin: 14.1 g/dL (ref 13.0–17.0)
Lymphocytes Relative: 30.2 % (ref 12.0–46.0)
Lymphs Abs: 2.2 10*3/uL (ref 0.7–4.0)
MCHC: 33.4 g/dL (ref 30.0–36.0)
MCV: 97.3 fl (ref 78.0–100.0)
MONO ABS: 0.5 10*3/uL (ref 0.1–1.0)
Monocytes Relative: 6.9 % (ref 3.0–12.0)
NEUTROS ABS: 4.5 10*3/uL (ref 1.4–7.7)
Neutrophils Relative %: 61.5 % (ref 43.0–77.0)
PLATELETS: 251 10*3/uL (ref 150.0–400.0)
RBC: 4.34 Mil/uL (ref 4.22–5.81)
RDW: 11.7 % (ref 11.5–15.5)
WBC: 7.3 10*3/uL (ref 4.0–10.5)

## 2017-05-21 LAB — COMPREHENSIVE METABOLIC PANEL
ALT: 11 U/L (ref 0–53)
AST: 16 U/L (ref 0–37)
Albumin: 4.5 g/dL (ref 3.5–5.2)
Alkaline Phosphatase: 42 U/L (ref 39–117)
BUN: 19 mg/dL (ref 6–23)
CHLORIDE: 101 meq/L (ref 96–112)
CO2: 31 meq/L (ref 19–32)
CREATININE: 1.08 mg/dL (ref 0.40–1.50)
Calcium: 9.5 mg/dL (ref 8.4–10.5)
GFR: 76.29 mL/min (ref >60.00)
Glucose, Bld: 99 mg/dL (ref 70–99)
Potassium: 4.5 mEq/L (ref 3.5–5.1)
SODIUM: 138 meq/L (ref 135–145)
Total Bilirubin: 0.8 mg/dL (ref 0.2–1.2)
Total Protein: 6.8 g/dL (ref 6.0–8.3)

## 2017-05-21 LAB — URINALYSIS, ROUTINE W REFLEX MICROSCOPIC
Bilirubin Urine: NEGATIVE
Hgb urine dipstick: NEGATIVE
Ketones, ur: NEGATIVE
Leukocytes, UA: NEGATIVE
Nitrite: NEGATIVE
RBC / HPF: NONE SEEN (ref 0–?)
SPECIFIC GRAVITY, URINE: 1.01 (ref 1.000–1.030)
Total Protein, Urine: NEGATIVE
Urine Glucose: NEGATIVE
Urobilinogen, UA: 0.2 (ref 0.0–1.0)
pH: 6.5 (ref 5.0–8.0)

## 2017-05-21 LAB — LIPID PANEL
CHOLESTEROL: 193 mg/dL (ref 0–200)
HDL: 45.5 mg/dL (ref >39.00)
LDL CALC: 122 mg/dL — AB (ref 0–99)
NonHDL: 147.42
TRIGLYCERIDES: 128 mg/dL (ref 0.0–149.0)
Total CHOL/HDL Ratio: 4
VLDL: 25.6 mg/dL (ref 0.0–40.0)

## 2017-05-21 LAB — HEMOGLOBIN A1C: Hgb A1c MFr Bld: 5.4 % (ref 4.6–6.5)

## 2017-05-21 LAB — PSA: PSA: 1.86 ng/mL (ref 0.10–4.00)

## 2017-05-21 NOTE — Assessment & Plan Note (Signed)
The natural history of prostate cancer and ongoing controversy regarding screening and potential treatment outcomes of prostate cancer has been discussed with the patient. The meaning of a false positive PSA and a false negative PSA has been discussed. He indicates understanding of the limitations of this screening test and wishes  to proceed with screening PSA testing.  

## 2017-05-21 NOTE — Assessment & Plan Note (Signed)
Referral to GI placed for screening colonoscopy.  

## 2017-05-21 NOTE — Progress Notes (Signed)
Patient presents to clinic today for annual exam.  Patient is fasting for labs.  Diet -- Patient endorses watching portion sizes and making wise choices. Limiting red meats and eating a wide selection of vegetables. Exercise -- stays very active through work.   Acute Concerns: Denies acute concerns today.  Health Maintenance: Immunizations -- Tetanus up-to-date. Declines flu shot.  Colonoscopy -- Overdue. Average risk. Agrees to referral.   Past Medical History:  Diagnosis Date  . Hx of blood clots    after surgical procedure. no other issues.     Past Surgical History:  Procedure Laterality Date  . LEG SURGERY Left    broken femur, blood clot after surgery    No current outpatient medications on file prior to visit.   No current facility-administered medications on file prior to visit.     No Known Allergies  Family History  Problem Relation Age of Onset  . Ehlers-Danlos syndrome Daughter   . Lung cancer Maternal Aunt 69       + extensive smoking history    Social History   Socioeconomic History  . Marital status: Married    Spouse name: Not on file  . Number of children: Not on file  . Years of education: Not on file  . Highest education level: Not on file  Social Needs  . Financial resource strain: Not on file  . Food insecurity - worry: Not on file  . Food insecurity - inability: Not on file  . Transportation needs - medical: Not on file  . Transportation needs - non-medical: Not on file  Occupational History  . Not on file  Tobacco Use  . Smoking status: Never Smoker  . Smokeless tobacco: Current User    Types: Chew  Substance and Sexual Activity  . Alcohol use: Yes    Comment: rarely  . Drug use: No  . Sexual activity: Yes  Other Topics Concern  . Not on file  Social History Narrative  . Not on file    Review of Systems  Constitutional: Negative for fever and weight loss.  HENT: Negative for ear discharge, ear pain, hearing loss and  tinnitus.   Eyes: Negative for blurred vision, double vision, photophobia and pain.  Respiratory: Negative for cough and shortness of breath.   Cardiovascular: Negative for chest pain and palpitations.  Gastrointestinal: Negative for abdominal pain, blood in stool, constipation, diarrhea, heartburn, melena, nausea and vomiting.  Genitourinary: Negative for dysuria, flank pain, frequency, hematuria and urgency.  Musculoskeletal: Negative for falls.  Neurological: Negative for dizziness, loss of consciousness and headaches.  Endo/Heme/Allergies: Negative for environmental allergies.  Psychiatric/Behavioral: Negative for depression, hallucinations, substance abuse and suicidal ideas. The patient is not nervous/anxious and does not have insomnia.    BP 128/84   Pulse 67   Temp 98.1 F (36.7 C) (Oral)   Resp 14   Ht 5\' 8"  (1.727 m)   Wt 195 lb (88.5 kg)   SpO2 98%   BMI 29.65 kg/m   Physical Exam  Constitutional: He is oriented to person, place, and time and well-developed, well-nourished, and in no distress.  HENT:  Head: Normocephalic and atraumatic.  Right Ear: External ear normal.  Left Ear: External ear normal.  Nose: Nose normal.  Mouth/Throat: Oropharynx is clear and moist. No oropharyngeal exudate.  Eyes: Conjunctivae and EOM are normal. Pupils are equal, round, and reactive to light.  Neck: Neck supple. No thyromegaly present.  Cardiovascular: Normal rate, regular rhythm, normal heart  sounds and intact distal pulses.  Pulmonary/Chest: Effort normal and breath sounds normal. No respiratory distress. He has no wheezes. He has no rales. He exhibits no tenderness.  Abdominal: Soft. Bowel sounds are normal. He exhibits no distension and no mass. There is no tenderness. There is no rebound and no guarding.  Genitourinary: Testes/scrotum normal and penis normal. No discharge found.  Lymphadenopathy:    He has no cervical adenopathy.  Neurological: He is alert and oriented to  person, place, and time.  Skin: Skin is warm and dry. No rash noted.  Psychiatric: Affect normal.  Vitals reviewed.  Assessment/Plan: Colon cancer screening Referral to GI placed for screening colonoscopy.  Prostate cancer screening The natural history of prostate cancer and ongoing controversy regarding screening and potential treatment outcomes of prostate cancer has been discussed with the patient. The meaning of a false positive PSA and a false negative PSA has been discussed. He indicates understanding of the limitations of this screening test and wishes  to proceed with screening PSA testing.   Visit for preventive health examination Depression screen negative. Health Maintenance reviewed -- Declines flu shot. Tetanus up-to-date. Preventive schedule discussed and handout given in AVS. Will obtain fasting labs today.     Piedad ClimesWilliam Cody Nikky Duba, PA-C

## 2017-05-21 NOTE — Assessment & Plan Note (Signed)
Depression screen negative. Health Maintenance reviewed -- Declines flu shot. Tetanus up-to-date. Preventive schedule discussed and handout given in AVS. Will obtain fasting labs today.  

## 2017-05-21 NOTE — Patient Instructions (Signed)
Please go to the lab for blood work.   Our office will call you with your results unless you have chosen to receive results via MyChart.  If your blood work is normal we will follow-up each year for physicals and as scheduled for chronic medical problems.  If anything is abnormal we will treat accordingly and get you in for a follow-up.  You will be contacted for a screening colonoscopy.   Preventive Care 40-64 Years, Male Preventive care refers to lifestyle choices and visits with your health care provider that can promote health and wellness. What does preventive care include?  A yearly physical exam. This is also called an annual well check.  Dental exams once or twice a year.  Routine eye exams. Ask your health care provider how often you should have your eyes checked.  Personal lifestyle choices, including: ? Daily care of your teeth and gums. ? Regular physical activity. ? Eating a healthy diet. ? Avoiding tobacco and drug use. ? Limiting alcohol use. ? Practicing safe sex. ? Taking low-dose aspirin every day starting at age 53. What happens during an annual well check? The services and screenings done by your health care provider during your annual well check will depend on your age, overall health, lifestyle risk factors, and family history of disease. Counseling Your health care provider may ask you questions about your:  Alcohol use.  Tobacco use.  Drug use.  Emotional well-being.  Home and relationship well-being.  Sexual activity.  Eating habits.  Work and work Statistician.  Screening You may have the following tests or measurements:  Height, weight, and BMI.  Blood pressure.  Lipid and cholesterol levels. These may be checked every 5 years, or more frequently if you are over 64 years old.  Skin check.  Lung cancer screening. You may have this screening every year starting at age 53 if you have a 30-pack-year history of smoking and currently  smoke or have quit within the past 15 years.  Fecal occult blood test (FOBT) of the stool. You may have this test every year starting at age 53.  Flexible sigmoidoscopy or colonoscopy. You may have a sigmoidoscopy every 5 years or a colonoscopy every 10 years starting at age 75.  Prostate cancer screening. Recommendations will vary depending on your family history and other risks.  Hepatitis C blood test.  Hepatitis B blood test.  Sexually transmitted disease (STD) testing.  Diabetes screening. This is done by checking your blood sugar (glucose) after you have not eaten for a while (fasting). You may have this done every 1-3 years.  Discuss your test results, treatment options, and if necessary, the need for more tests with your health care provider. Vaccines Your health care provider may recommend certain vaccines, such as:  Influenza vaccine. This is recommended every year.  Tetanus, diphtheria, and acellular pertussis (Tdap, Td) vaccine. You may need a Td booster every 10 years.  Varicella vaccine. You may need this if you have not been vaccinated.  Zoster vaccine. You may need this after age 53.  Measles, mumps, and rubella (MMR) vaccine. You may need at least one dose of MMR if you were born in 1957 or later. You may also need a second dose.  Pneumococcal 13-valent conjugate (PCV13) vaccine. You may need this if you have certain conditions and have not been vaccinated.  Pneumococcal polysaccharide (PPSV23) vaccine. You may need one or two doses if you smoke cigarettes or if you have certain conditions.  Meningococcal  vaccine. You may need this if you have certain conditions.  Hepatitis A vaccine. You may need this if you have certain conditions or if you travel or work in places where you may be exposed to hepatitis A.  Hepatitis B vaccine. You may need this if you have certain conditions or if you travel or work in places where you may be exposed to hepatitis  B.  Haemophilus influenzae type b (Hib) vaccine. You may need this if you have certain risk factors.  Talk to your health care provider about which screenings and vaccines you need and how often you need them. This information is not intended to replace advice given to you by your health care provider. Make sure you discuss any questions you have with your health care provider. Document Released: 05/27/2015 Document Revised: 01/18/2016 Document Reviewed: 03/01/2015 Elsevier Interactive Patient Education  [employer].

## 2017-12-08 ENCOUNTER — Other Ambulatory Visit: Payer: Self-pay | Admitting: Physician Assistant

## 2017-12-24 ENCOUNTER — Ambulatory Visit: Payer: Self-pay | Admitting: *Deleted

## 2017-12-24 NOTE — Telephone Encounter (Signed)
Pt's wife called to schedule an appointment. Husband is working all the time and is feeling tired and lack of energy. He can feel it in his chest and upper body. No c/o chest pain, or any cardiac or other symptoms.  Appointment scheduled per pt request.   Reason for Disposition . Requesting regular office appointment  Answer Assessment - Initial Assessment Questions 1. REASON FOR CALL or QUESTION: "What is your reason for calling today?" or "How can I best help you?" or "What question do you have that I can help answer?"     Pt's wife called for husband, to schedule an appointment for lack of energy.  Protocols used: INFORMATION ONLY CALL-A-AH

## 2017-12-26 ENCOUNTER — Other Ambulatory Visit: Payer: Self-pay

## 2017-12-26 ENCOUNTER — Ambulatory Visit: Payer: 59 | Admitting: Physician Assistant

## 2017-12-26 ENCOUNTER — Encounter: Payer: Self-pay | Admitting: Physician Assistant

## 2017-12-26 VITALS — BP 100/74 | HR 65 | Temp 97.7°F | Resp 16 | Ht 68.0 in | Wt 188.2 lb

## 2017-12-26 DIAGNOSIS — K219 Gastro-esophageal reflux disease without esophagitis: Secondary | ICD-10-CM | POA: Diagnosis not present

## 2017-12-26 DIAGNOSIS — G44209 Tension-type headache, unspecified, not intractable: Secondary | ICD-10-CM

## 2017-12-26 MED ORDER — TIZANIDINE HCL 2 MG PO TABS: 2.0000 mg | ORAL_TABLET | Freq: Every day | ORAL | 0 refills | Status: DC

## 2017-12-26 MED ORDER — OMEPRAZOLE 40 MG PO CPDR
40.0000 mg | DELAYED_RELEASE_CAPSULE | Freq: Every day | ORAL | 2 refills | Status: DC
Start: 2017-12-26 — End: 2018-04-22

## 2017-12-26 NOTE — Progress Notes (Signed)
Patient presents to clinic today c/o several months of intermittent neck tension and tightness with band-like headache. Is working 80+ hours per week, has been doing this for a number of years but feels like it is catching up with him. Notes some increased stressors. Denies anxiety/depressed mood. Denies any known trauma or injury. Symptoms are more prevalent at the end of a long day. Patient also notes burning sensation in abdomen and chest, worse after laying down at night. Since working long hours he notes he typically eats dinner at 8:30 and lays down for bed at 9 PM. Denies change to bowel habits, melena, hematochezia or tenesmus. Denies nausea/vomiting or epigastric pain.  Past Medical History:  Diagnosis Date  . Hx of blood clots    after surgical procedure. no other issues.     No current outpatient medications on file prior to visit.   No current facility-administered medications on file prior to visit.     No Known Allergies  Family History  Problem Relation Age of Onset  . Ehlers-Danlos syndrome Daughter   . Lung cancer Maternal Aunt [id number]       + extensive smoking history    Social History   Socioeconomic History  . Marital status: Married    Spouse name: Not on file  . Number of children: Not on file  . Years of education: Not on file  . Highest education level: Not on file  Occupational History  . Not on file  Social Needs  . Financial resource strain: Not on file  . Food insecurity:    Worry: Not on file    Inability: Not on file  . Transportation needs:    Medical: Not on file    Non-medical: Not on file  Tobacco Use  . Smoking status: Never Smoker  . Smokeless tobacco: Current User    Types: Chew  Substance and Sexual Activity  . Alcohol use: Yes    Comment: rarely  . Drug use: No  . Sexual activity: Yes  Lifestyle  . Physical activity:    Days per week: 4 days    Minutes per session: 60 min  . Stress: Not on file  Relationships  . Social  connections:    Talks on phone: Not on file    Gets together: Not on file    Attends religious service: Not on file    Active member of club or organization: Not on file    Attends meetings of clubs or organizations: Not on file    Relationship status: Not on file  Other Topics Concern  . Not on file  Social History Narrative  . Not on file   Review of Systems - See HPI.  All other ROS are negative.  BP 100/74   Pulse 65   Temp 97.7 F (36.5 C) (Oral)   Resp 16   Ht 5\' 8"  (1.727 m)   Wt 188 lb 3.2 oz (85.4 kg)   SpO2 97%   BMI 28.62 kg/m   Physical Exam  Constitutional: He is oriented to person, place, and time. He appears well-developed and well-nourished.  HENT:  Head: Normocephalic and atraumatic.  Eyes: Conjunctivae are normal.  Neck: Neck supple.  Cardiovascular: Normal rate, regular rhythm, normal heart sounds and intact distal pulses.  Pulmonary/Chest: Effort normal and breath sounds normal.  Abdominal: Soft. Bowel sounds are normal. He exhibits no distension and no mass. There is no tenderness. There is no guarding.  Musculoskeletal:  Right shoulder: Normal.       Left shoulder: Normal.       Cervical back: He exhibits spasm (mild with tension in trapezius bilaterally). He exhibits normal range of motion and no tenderness.  Neurological: He is alert and oriented to person, place, and time.  Psychiatric: He has a normal mood and affect.  Vitals reviewed.  Assessment/Plan: 1. Tension headache Supportive measures and OTC medications reviewed. Massages recommended. Recommended suggestions to promote a better work-life balance. Rx tizanidine for trial use. Follow-up 2 weeks. - tiZANidine (ZANAFLEX) 2 MG tablet; Take 1 tablet (2 mg total) by mouth at bedtime.  Dispense: 30 tablet; Refill: 0  2. Gastroesophageal reflux disease without esophagitis GERD diet reviewed. Needs to cease late-night eating. Rx omeprazole. Follow-up 2 weeks.  - omeprazole (PRILOSEC) 40  MG capsule; Take 1 capsule (40 mg total) by mouth daily.  Dispense: 30 capsule; Refill: 2   Piedad ClimesWilliam Cody Dondrell Loudermilk, PA-C

## 2017-12-26 NOTE — Patient Instructions (Addendum)
Please keep well-hydrated and try to get plenty of rest. Avoid late-night eating. Elevate the head of your bed. Follow the dietary recommendations below. Start the Omeprazole, taking daily as directed for 2 weeks.  Also recommend a massage to help with neck tension -- this is where you are keeping your stress. Topical Icy Hot or Aspercreme may also be beneficial. Use the Tizanidine at night to see if this helps.  You have got to keep working on your work-life balance.  Follow-up with me in 2 weeks.    Food Choices for Gastroesophageal Reflux Disease, Adult When you have gastroesophageal reflux disease (GERD), the foods you eat and your eating habits are very important. Choosing the right foods can help ease your discomfort. What guidelines do I need to follow?  Choose fruits, vegetables, whole grains, and low-fat dairy products.  Choose low-fat meat, fish, and poultry.  Limit fats such as oils, salad dressings, butter, nuts, and avocado.  Keep a food diary. This helps you identify foods that cause symptoms.  Avoid foods that cause symptoms. These may be different for everyone.  Eat small meals often instead of 3 large meals a day.  Eat your meals slowly, in a place where you are relaxed.  Limit fried foods.  Cook foods using methods other than frying.  Avoid drinking alcohol.  Avoid drinking large amounts of liquids with your meals.  Avoid bending over or lying down until 2-3 hours after eating. What foods are not recommended? These are some foods and drinks that may make your symptoms worse: Vegetables Tomatoes. Tomato juice. Tomato and spaghetti sauce. Chili peppers. Onion and garlic. Horseradish. Fruits Oranges, grapefruit, and lemon (fruit and juice). Meats High-fat meats, fish, and poultry. This includes hot dogs, ribs, ham, sausage, salami, and bacon. Dairy Whole milk and chocolate milk. Sour cream. Cream. Butter. Ice cream. Cream cheese. Drinks Coffee and  tea. Bubbly (carbonated) drinks or energy drinks. Condiments Hot sauce. Barbecue sauce. Sweets/Desserts Chocolate and cocoa. Donuts. Peppermint and spearmint. Fats and Oils High-fat foods. This includes [place] fries and potato chips. Other Vinegar. Strong spices. This includes black pepper, white pepper, red pepper, cayenne, curry powder, cloves, ginger, and chili powder. The items listed above may not be a complete list of foods and drinks to avoid. Contact your dietitian for more information. This information is not intended to replace advice given to you by your health care provider. Make sure you discuss any questions you have with your health care provider. Document Released: 10/30/2011 Document Revised: 10/06/2015 Document Reviewed: 03/04/2013 Elsevier Interactive Patient Education  2017 [employer]

## 2018-01-09 ENCOUNTER — Ambulatory Visit: Payer: 59 | Admitting: Physician Assistant

## 2018-01-09 DIAGNOSIS — Z0289 Encounter for other administrative examinations: Secondary | ICD-10-CM

## 2018-04-01 ENCOUNTER — Telehealth: Payer: Self-pay | Admitting: Physician Assistant

## 2018-04-01 NOTE — Telephone Encounter (Signed)
Ok to transfer since this is a family member of current pt

## 2018-04-01 NOTE — Telephone Encounter (Signed)
Typically we do not allow transfer from one provider in the office to another. Will leave final decision to Dr. Beverely Lowabori as she would be the accepting provider.

## 2018-04-01 NOTE — Telephone Encounter (Signed)
Are you ok with this?     Copied from CRM [phone]. Topic: Appointment Scheduling - Transfer of Care >> Apr 01, 2018 11:41 AM [name], [name] wrote: Pt is requesting to transfer FROM: Jason Browning  Pt is requesting to transfer TO: Jason Browning Reason for requested transfer: pt wife goes to [employer]   Send CRM to patient's current PCP (transferring FROM).

## 2018-04-01 NOTE — Telephone Encounter (Signed)
LM making pt aware that Dr. Beverely Lowabori will be pt's pcp moving forward.

## 2018-04-22 ENCOUNTER — Ambulatory Visit: Payer: 59 | Admitting: Physician Assistant

## 2018-04-22 ENCOUNTER — Other Ambulatory Visit: Payer: Self-pay

## 2018-04-22 ENCOUNTER — Encounter: Payer: Self-pay | Admitting: Physician Assistant

## 2018-04-22 VITALS — BP 114/80 | HR 57 | Temp 97.8°F | Resp 14 | Ht 68.0 in | Wt 188.0 lb

## 2018-04-22 DIAGNOSIS — K219 Gastro-esophageal reflux disease without esophagitis: Secondary | ICD-10-CM | POA: Diagnosis not present

## 2018-04-22 DIAGNOSIS — Z1211 Encounter for screening for malignant neoplasm of colon: Secondary | ICD-10-CM

## 2018-04-22 MED ORDER — PANTOPRAZOLE SODIUM 40 MG PO TBEC
40.0000 mg | DELAYED_RELEASE_TABLET | Freq: Every day | ORAL | 3 refills | Status: DC
Start: 2018-04-22 — End: 2018-08-25

## 2018-04-22 NOTE — Progress Notes (Signed)
Patient presents to clinic today c/o continued GERD symptoms over the past couple of months despite use of Prilosec. Notes daily heartburn with occasional epigastric pain and sense of fullness. Denies nausea or vomiting. Denies melena, hematochezia or tenesmus. Denies fever, chills or change in weight.   Past Medical History:  Diagnosis Date  . Hx of blood clots    after surgical procedure. no other issues.     No current outpatient medications on file prior to visit.   No current facility-administered medications on file prior to visit.     No Known Allergies  Family History  Problem Relation Age of Onset  . Ehlers-Danlos syndrome Daughter   . Lung cancer Maternal Aunt 77       + extensive smoking history    Social History   Socioeconomic History  . Marital status: Married    Spouse name: Not on file  . Number of children: Not on file  . Years of education: Not on file  . Highest education level: Not on file  Occupational History  . Not on file  Social Needs  . Financial resource strain: Not on file  . Food insecurity:    Worry: Not on file    Inability: Not on file  . Transportation needs:    Medical: Not on file    Non-medical: Not on file  Tobacco Use  . Smoking status: Never Smoker  . Smokeless tobacco: Current User    Types: Chew  Substance and Sexual Activity  . Alcohol use: Yes    Comment: rarely  . Drug use: No  . Sexual activity: Yes  Lifestyle  . Physical activity:    Days per week: 4 days    Minutes per session: 60 min  . Stress: Not on file  Relationships  . Social connections:    Talks on phone: Not on file    Gets together: Not on file    Attends religious service: Not on file    Active member of club or organization: Not on file    Attends meetings of clubs or organizations: Not on file    Relationship status: Not on file  Other Topics Concern  . Not on file  Social History Narrative  . Not on file   Review of Systems - See HPI.   All other ROS are negative.  BP 114/80   Pulse (!) 57   Temp 97.8 F (36.6 C) (Oral)   Resp 14   Ht 5\' 8"  (1.727 m)   Wt 188 lb (85.3 kg)   SpO2 98%   BMI 28.59 kg/m   Physical Exam  Constitutional: He is oriented to person, place, and time. He appears well-developed and well-nourished.  HENT:  Head: Normocephalic and atraumatic.  Eyes: Conjunctivae are normal.  Neck: Neck supple.  Cardiovascular: Normal rate, regular rhythm, normal heart sounds and intact distal pulses.  Pulmonary/Chest: Effort normal and breath sounds normal. No stridor. No respiratory distress. He has no wheezes. He has no rales. He exhibits no tenderness.  Abdominal: Soft. Bowel sounds are normal. He exhibits no distension. There is no tenderness.  Neurological: He is alert and oriented to person, place, and time.  Psychiatric: He has a normal mood and affect.  Vitals reviewed.  Assessment/Plan: 1. Screening for colon cancer Referral to GI placed for screening colonoscopy. - Ambulatory referral to Gastroenterology  2. Gastroesophageal reflux disease without esophagitis Stop Omeprazole. Start Protonix daily. Start daily Probiotic. Continue GERD diet. Avoid late-night eating. Elevate  head of bed. Limit trigger foods. Follow-up if not improving over the next 10-14 days.  - pantoprazole (PROTONIX) 40 MG tablet; Take 1 tablet (40 mg total) by mouth daily.  Dispense: 30 tablet; Refill: 3   Piedad ClimesWilliam Cody Jaquay Posthumus, [place]PA-C

## 2018-04-22 NOTE — Patient Instructions (Signed)
Please keep hydrated and get plenty of rest.  Start a daily probiotic. Keep up with dietary changes -- see recommendations below. No late night eating.  Elevate the head of your bed.  Stop the Omeprazole and start the Protonix daily as directed. Follow-up if not noting significant improvement in the next 10-14 days.   You will be contacted for assessment with Gastroenterology for your screening colonoscopy.    Food Choices for Gastroesophageal Reflux Disease, Adult When you have gastroesophageal reflux disease (GERD), the foods you eat and your eating habits are very important. Choosing the right foods can help ease your discomfort. What guidelines do I need to follow?  Choose fruits, vegetables, whole grains, and low-fat dairy products.  Choose low-fat meat, fish, and poultry.  Limit fats such as oils, salad dressings, butter, nuts, and avocado.  Keep a food diary. This helps you identify foods that cause symptoms.  Avoid foods that cause symptoms. These may be different for everyone.  Eat small meals often instead of 3 large meals a day.  Eat your meals slowly, in a place where you are relaxed.  Limit fried foods.  Cook foods using methods other than frying.  Avoid drinking alcohol.  Avoid drinking large amounts of liquids with your meals.  Avoid bending over or lying down until 2-3 hours after eating. What foods are not recommended? These are some foods and drinks that may make your symptoms worse: Vegetables Tomatoes. Tomato juice. Tomato and spaghetti sauce. Chili peppers. Onion and garlic. Horseradish. Fruits Oranges, grapefruit, and lemon (fruit and juice). Meats High-fat meats, fish, and poultry. This includes hot dogs, ribs, ham, sausage, salami, and bacon. Dairy Whole milk and chocolate milk. Sour cream. Cream. Butter. Ice cream. Cream cheese. Drinks Coffee and tea. Bubbly (carbonated) drinks or energy drinks. Condiments Hot sauce. Barbecue  sauce. Sweets/Desserts Chocolate and cocoa. Donuts. Peppermint and spearmint. Fats and Oils High-fat foods. This includes [place] fries and potato chips. Other Vinegar. Strong spices. This includes black pepper, white pepper, red pepper, cayenne, curry powder, cloves, ginger, and chili powder. The items listed above may not be a complete list of foods and drinks to avoid. Contact your dietitian for more information. This information is not intended to replace advice given to you by your health care provider. Make sure you discuss any questions you have with your health care provider. Document Released: 10/30/2011 Document Revised: 10/06/2015 Document Reviewed: 03/04/2013 Elsevier Interactive Patient Education  2017 [employer]

## 2018-07-04 ENCOUNTER — Encounter: Payer: Self-pay | Admitting: Physician Assistant

## 2018-08-25 ENCOUNTER — Other Ambulatory Visit: Payer: Self-pay | Admitting: Physician Assistant

## 2018-08-25 DIAGNOSIS — K219 Gastro-esophageal reflux disease without esophagitis: Secondary | ICD-10-CM

## 2018-11-16 ENCOUNTER — Other Ambulatory Visit: Payer: Self-pay | Admitting: Physician Assistant

## 2018-11-16 DIAGNOSIS — K219 Gastro-esophageal reflux disease without esophagitis: Secondary | ICD-10-CM

## 2018-11-24 ENCOUNTER — Other Ambulatory Visit: Payer: Self-pay | Admitting: Physician Assistant

## 2018-11-24 DIAGNOSIS — K219 Gastro-esophageal reflux disease without esophagitis: Secondary | ICD-10-CM

## 2018-11-24 MED ORDER — PANTOPRAZOLE SODIUM 40 MG PO TBEC: 40.0000 mg | DELAYED_RELEASE_TABLET | Freq: Every day | ORAL | 0 refills | Status: DC

## 2020-06-03 ENCOUNTER — Other Ambulatory Visit: Payer: Self-pay

## 2020-06-03 ENCOUNTER — Encounter: Payer: Self-pay | Admitting: Cardiovascular Disease

## 2020-06-03 ENCOUNTER — Ambulatory Visit: Payer: BLUE CROSS/BLUE SHIELD | Admitting: Cardiovascular Disease

## 2020-06-03 VITALS — BP 130/80 | HR 68 | Ht 69.0 in | Wt 199.0 lb

## 2020-06-03 DIAGNOSIS — Z9189 Other specified personal risk factors, not elsewhere classified: Secondary | ICD-10-CM

## 2020-06-03 DIAGNOSIS — E782 Mixed hyperlipidemia: Secondary | ICD-10-CM

## 2020-06-03 NOTE — Patient Instructions (Signed)
Medication Instructions:  The current medical regimen is effective;  continue present plan and medications.  *If you need a refill on your cardiac medications before your next appointment, please call your pharmacy*  Testing/Procedures: CALCIUM SCORE   Follow-Up: At [facility], you and your health needs are our priority.  As part of our continuing mission to provide you with exceptional heart care, we have created designated Provider Care Teams.  These Care Teams include your primary Cardiologist (physician) and Advanced Practice Providers (APPs -  Physician Assistants and Nurse Practitioners) who all work together to provide you with the care you need, when you need it.  We recommend signing up for the patient portal called "MyChart".  Sign up information is provided on this After Visit Summary.  MyChart is used to connect with patients for Virtual Visits (Telemedicine).  Patients are able to view lab/test results, encounter notes, upcoming appointments, etc.  Non-urgent messages can be sent to your provider as well.   To learn more about what you can do with MyChart, go to [email or web address].    Your next appointment:   12 month(s)  The format for your next appointment:   In Person  Provider:   Playita O'Neal, MD     

## 2020-06-03 NOTE — Progress Notes (Signed)
Cardiology Office Note:   Date:  06/03/2020  NAME:  Jason Browning    MRN: [id number] DOB:  Jun 15, 1965   PCP:  Sheliah Hatch, MD  Cardiologist:  No primary care provider on file.   Referring MD: Waldon Merl, PA-C   Chief Complaint  Patient presents with  . cvd assessment   History of Present Illness:   Jason Browning is a 55 y.o. male with a hx of GERD who is being seen today for the evaluation of cardiovascular disease risk assessment at the request of Noel Journey.  His wife recently had a heart attack.  This is concerned him.  He would like to have cardiovascular disease screening.  Medical history is significant for acid reflux.  He also uses chewing tobacco.  He is use chewing tobacco for nearly 30 years.  He denies any chest pain or shortness of breath.  No regular exercise reported.  BMI 29.  He is working on getting back to exercising more.  He reports he works 12 to 14-hour days.  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  It is been quite stressful at work recently.  No strong family history of heart disease.  No excess alcohol use reported.  No illicit drug use is reported.  Most recent labs show total cholesterol 193, HDL 45, LDL 122, triglycerides 128.  A1c 5.4.  Blood pressure 130/80.  On no medications.  10-year ASCVD risk score 6.1% which is borderline.  We discussed coronary calcium scoring in the office.  He is not interested.  EKG shows normal sinus rhythm with no acute ischemic changes or evidence of prior infarction.  Medical history significant for blood clots.  In 2010 he had femur fracture surgery.  Apparently had blood clots in the setting of this.  Past Medical History: Past Medical History:  Diagnosis Date  . Hx of blood clots    after surgical procedure. no other issues.     Past Surgical History: Past Surgical History:  Procedure Laterality Date  . LEG SURGERY Left    broken femur, blood clot after surgery    Current  Medications: Current Meds  Medication Sig  . pantoprazole (PROTONIX) 40 MG tablet Take 1 tablet (40 mg total) by mouth daily.     Allergies:    Patient has no known allergies.   Social History: Social History   Socioeconomic History  . Marital status: Married    Spouse name: Not on file  . Number of children: 1  . Years of education: Not on file  . Highest education level: Not on file  Occupational History  . Not on file  Tobacco Use  . Smoking status: Never Smoker  . Smokeless tobacco: Current User    Types: Chew  Vaping Use  . Vaping Use: Never used  Substance and Sexual Activity  . Alcohol use: Yes    Comment: rarely  . Drug use: No  . Sexual activity: Yes  Other Topics Concern  . Not on file  Social History Narrative  . Not on file   Social Determinants of Health   Financial Resource Strain: Not on file  Food Insecurity: Not on file  Transportation Needs: Not on file  Physical Activity: Not on file  Stress: Not on file  Social Connections: Not on file     Family History: The patient's family history includes Ehlers-Danlos syndrome in his daughter; Lung cancer (age of onset: 62) in his maternal aunt.  ROS:  All other ROS reviewed and negative. Pertinent positives noted in the HPI.     EKGs/Labs/Other Studies Reviewed:   The following studies were personally reviewed by me today:  EKG:  EKG is ordered today.  The ekg ordered today demonstrates normal sinus rhythm heart rate 68, no acute ischemic changes, no evidence of prior infarction, and was personally reviewed by me.   Recent Labs: No results found for requested labs within last 8760 hours.   Recent Lipid Panel    Component Value Date/Time   CHOL 193 05/21/2017 1014   TRIG 128.0 05/21/2017 1014   HDL 45.50 05/21/2017 1014   CHOLHDL 4 05/21/2017 1014   VLDL 25.6 05/21/2017 1014   LDLCALC 122 (H) 05/21/2017 1014    Physical Exam:   VS:  BP 130/80   Pulse 68   Ht 5\' 9"  (1.753 m)   Wt 199  lb (90.3 kg)   BMI 29.39 kg/m    Wt Readings from Last 3 Encounters:  06/03/20 199 lb (90.3 kg)  04/22/18 188 lb (85.3 kg)  12/26/17 188 lb 3.2 oz (85.4 kg)    General: Well nourished, well developed, in no acute distress Head: Atraumatic, normal size  Eyes: PEERLA, EOMI  Neck: Supple, no JVD Endocrine: No thryomegaly Cardiac: Normal S1, S2; RRR; no murmurs, rubs, or gallops Lungs: Clear to auscultation bilaterally, no wheezing, rhonchi or rales  Abd: Soft, nontender, no hepatomegaly  Ext: No edema, pulses 2+ Musculoskeletal: No deformities, BUE and BLE strength normal and equal Skin: Warm and dry, no rashes   Neuro: Alert and oriented to person, place, time, and situation, CNII-XII grossly intact, no focal deficits  Psych: Normal mood and affect   ASSESSMENT:   Jason Browning is a 55 y.o. male who presents for the following: 1. At increased risk for cardiovascular disease   2. Mixed hyperlipidemia     PLAN:   1. At increased risk for cardiovascular disease 2. Mixed hyperlipidemia -No symptoms of chest pain.  Wife recently had MI.  EKG shows normal sinus rhythm heart rate 68 with no acute ischemic changes or evidence of prior infarction.  CVD risk factors include tobacco abuse, physical inactivity and high stress job.  BPs are stable.  10-year ASCVD risk score is 6.1% which is borderline.  I recommended coronary calcium scoring for further risk ratification.  This will lead to possibly lipid-lowering agents versus a preemptive stress test.  He will see 53 yearly.  Disposition: Return in about 1 year (around 06/03/2021).  Medication Adjustments/Labs and Tests Ordered: Current medicines are reviewed at length with the patient today.  Concerns regarding medicines are outlined above.  Orders Placed This Encounter  Procedures  . CT CARDIAC SCORING (SELF PAY ONLY)  . EKG 12-Lead   No orders of the defined types were placed in this encounter.   Patient Instructions  Medication  Instructions:  The current medical regimen is effective;  continue present plan and medications.  *If you need a refill on your cardiac medications before your next appointment, please call your pharmacy*  Testing/Procedures: CALCIUM SCORE   Follow-Up: At [facility], you and your health needs are our priority.  As part of our continuing mission to provide you with exceptional heart care, we have created designated Provider Care Teams.  These Care Teams include your primary Cardiologist (physician) and Advanced Practice Providers (APPs -  Physician Assistants and Nurse Practitioners) who all work together to provide you with the care you need, when you need  it.  We recommend signing up for the patient portal called "MyChart".  Sign up information is provided on this After Visit Summary.  MyChart is used to connect with patients for Virtual Visits (Telemedicine).  Patients are able to view lab/test results, encounter notes, upcoming appointments, etc.  Non-urgent messages can be sent to your provider as well.   To learn more about what you can do with MyChart, go to [email or web address].    Your next appointment:   12 month(s)  The format for your next appointment:   In Person  Provider:   Lennie Odor, MD      Signed, Lenna Gilford. Flora Lipps, MD [facility]  [address] [place], [place] [address] [phone]  06/03/2020 3:38 PM

## 2020-07-06 ENCOUNTER — Other Ambulatory Visit: Payer: Self-pay

## 2020-07-06 ENCOUNTER — Ambulatory Visit (INDEPENDENT_AMBULATORY_CARE_PROVIDER_SITE_OTHER)
Admission: RE | Admit: 2020-07-06 | Discharge: 2020-07-06 | Disposition: A | Discharge status: Home / Self Care | Payer: Self-pay | Source: Ambulatory Visit | Attending: Cardiovascular Disease | Admitting: Cardiovascular Disease

## 2020-07-06 DIAGNOSIS — Z9189 Other specified personal risk factors, not elsewhere classified: Secondary | ICD-10-CM

## 2021-07-06 NOTE — Progress Notes (Signed)
Cardiology Office Note:   Date:  07/07/2021  NAME:  Jason Browning    MRN: [id number] DOB:  May 28, 1965   PCP:  Sheliah Hatch, MD  Cardiologist:  None  Electrophysiologist:  None   Referring MD: Sheliah Hatch, MD   Chief Complaint  Patient presents with   Follow-up        History of Present Illness:   Jason Browning is a 56 y.o. male with a hx of HLD who presents for follow-up.  He reports he is doing well.  Denies any chest pain or trouble breathing.  Has not had recent lab work.  Needs to see his primary doctor.  Also needs a colonoscopy.  We pursued coronary calcium scoring last year.  Score 0.7.  Overall low risk.  LDL cholesterol 120.  Likely okay.  Discussed that he can discontinue the lifestyle modification.  No need for aspirin or anything at this time.  I have recommended repeat calcium scoring in 5 years.  He denies any major symptoms in office.  Seems to be doing well.  T chol 193, HDL 45, LDL 122, TG 128  Problem List Coronary calcium -CAC 0.7 (43rd percentile)  Past Medical History: Past Medical History:  Diagnosis Date   Hx of blood clots    after surgical procedure. no other issues.     Past Surgical History: Past Surgical History:  Procedure Laterality Date   LEG SURGERY Left    broken femur, blood clot after surgery    Current Medications: No outpatient medications have been marked as taking for the 07/07/21 encounter (Office Visit) with Sande Rives, MD.     Allergies:    Patient has no known allergies.   Social History: Social History   Socioeconomic History   Marital status: Married    Spouse name: Not on file   Number of children: 1   Years of education: Not on file   Highest education level: Not on file  Occupational History   Not on file  Tobacco Use   Smoking status: Never   Smokeless tobacco: Current    Types: Chew  Vaping Use   Vaping Use: Never used  Substance and Sexual Activity   Alcohol use: Yes     Comment: rarely   Drug use: No   Sexual activity: Yes  Other Topics Concern   Not on file  Social History Narrative   Not on file   Social Determinants of Health   Financial Resource Strain: Not on file  Food Insecurity: Not on file  Transportation Needs: Not on file  Physical Activity: Not on file  Stress: Not on file  Social Connections: Not on file     Family History: The patient's family history includes Ehlers-Danlos syndrome in his daughter; Lung cancer (age of onset: 79) in his maternal aunt.  ROS:   All other ROS reviewed and negative. Pertinent positives noted in the HPI.     EKGs/Labs/Other Studies Reviewed:   The following studies were personally reviewed by me today:   Recent Labs: No results found for requested labs within last 8760 hours.   Recent Lipid Panel    Component Value Date/Time   CHOL 193 05/21/2017 1014   TRIG 128.0 05/21/2017 1014   HDL 45.50 05/21/2017 1014   CHOLHDL 4 05/21/2017 1014   VLDL 25.6 05/21/2017 1014   LDLCALC 122 (H) 05/21/2017 1014    Physical Exam:   VS:  BP 110/72    Pulse 73  Ht 5\' 9"  (1.753 m)    Wt 181 lb 9.6 oz (82.4 kg)    SpO2 97%    BMI 26.82 kg/m    Wt Readings from Last 3 Encounters:  07/07/21 181 lb 9.6 oz (82.4 kg)  06/03/20 199 lb (90.3 kg)  04/22/18 188 lb (85.3 kg)    General: Well nourished, well developed, in no acute distress Head: Atraumatic, normal size  Eyes: PEERLA, EOMI  Neck: Supple, no JVD Endocrine: No thryomegaly Cardiac: Normal S1, S2; RRR; no murmurs, rubs, or gallops Lungs: Clear to auscultation bilaterally, no wheezing, rhonchi or rales  Abd: Soft, nontender, no hepatomegaly  Ext: No edema, pulses 2+ Musculoskeletal: No deformities, BUE and BLE strength normal and equal Skin: Warm and dry, no rashes   Neuro: Alert and oriented to person, place, time, and situation, CNII-XII grossly intact, no focal deficits  Psych: Normal mood and affect   ASSESSMENT:   Jason Browning is a 56  y.o. male who presents for the following: 1. Mixed hyperlipidemia   2. Agatston coronary artery calcium score less than 100     PLAN:   1. Mixed hyperlipidemia 2. Agatston coronary artery calcium score less than 100 -Coronary calcium score 0.7 which is 43rd percentile.  Most recent LDL cholesterol 122.  Discussed no strong benefit to be on statin at this time.  I think he can continue with lifestyle modification.  His score is still low.  No need for statin therapy.  I have recommended he repeat a calcium score in 5 years.  For now we will continue with diet and exercise.      Disposition: Return if symptoms worsen or fail to improve.  Medication Adjustments/Labs and Tests Ordered: Current medicines are reviewed at length with the patient today.  Concerns regarding medicines are outlined above.  No orders of the defined types were placed in this encounter.  No orders of the defined types were placed in this encounter.   Patient Instructions  Medication Instructions:  The current medical regimen is effective;  continue present plan and medications.  *If you need a refill on your cardiac medications before your next appointment, please call your pharmacy*   Follow-Up: At [facility], you and your health needs are our priority.  As part of our continuing mission to provide you with exceptional heart care, we have created designated Provider Care Teams.  These Care Teams include your primary Cardiologist (physician) and Advanced Practice Providers (APPs -  Physician Assistants and Nurse Practitioners) who all work together to provide you with the care you need, when you need it.  We recommend signing up for the patient portal called "MyChart".  Sign up information is provided on this After Visit Summary.  MyChart is used to connect with patients for Virtual Visits (Telemedicine).  Patients are able to view lab/test results, encounter notes, upcoming appointments, etc.  Non-urgent  messages can be sent to your provider as well.   To learn more about what you can do with MyChart, go to [facility].    Your next appointment:   As needed  The format for your next appointment:   In Person  Provider:   [email or web address], MD      Time Spent with Patient: I have spent a total of 25 minutes with patient reviewing hospital notes, telemetry, EKGs, labs and examining the patient as well as establishing an assessment and plan that was discussed with the patient.  > 50% of time was spent in  direct patient care.  Signed, Lenna Gilford. Flora Lipps, MD, [facility]   [facility]  [address], [address] [place], [place] [address] [phone]  07/07/2021 2:53 PM

## 2021-07-07 ENCOUNTER — Ambulatory Visit: Payer: BC Managed Care – PPO | Admitting: Cardiovascular Disease

## 2021-07-07 ENCOUNTER — Other Ambulatory Visit: Payer: Self-pay

## 2021-07-07 ENCOUNTER — Encounter: Payer: Self-pay | Admitting: Cardiovascular Disease

## 2021-07-07 VITALS — BP 110/72 | HR 73 | Ht 69.0 in | Wt 181.6 lb

## 2021-07-07 DIAGNOSIS — R931 Abnormal findings on diagnostic imaging of heart and coronary circulation: Secondary | ICD-10-CM

## 2021-07-07 DIAGNOSIS — E782 Mixed hyperlipidemia: Secondary | ICD-10-CM | POA: Diagnosis not present

## 2021-07-07 NOTE — Patient Instructions (Signed)
Medication Instructions:  The current medical regimen is effective;  continue present plan and medications.  *If you need a refill on your cardiac medications before your next appointment, please call your pharmacy*    Follow-Up: At [facility], you and your health needs are our priority.  As part of our continuing mission to provide you with exceptional heart care, we have created designated Provider Care Teams.  These Care Teams include your primary Cardiologist (physician) and Advanced Practice Providers (APPs -  Physician Assistants and Nurse Practitioners) who all work together to provide you with the care you need, when you need it.  We recommend signing up for the patient portal called "MyChart".  Sign up information is provided on this After Visit Summary.  MyChart is used to connect with patients for Virtual Visits (Telemedicine).  Patients are able to view lab/test results, encounter notes, upcoming appointments, etc.  Non-urgent messages can be sent to your provider as well.   To learn more about what you can do with MyChart, go to [email or web address].    Your next appointment:   As needed  The format for your next appointment:   In Person  Provider:   Homerville O'Neal, MD      

## 2022-05-18 DIAGNOSIS — M25551 Pain in right hip: Secondary | ICD-10-CM | POA: Diagnosis not present

## 2022-06-21 ENCOUNTER — Encounter: Payer: BC Managed Care – PPO | Admitting: Family Medicine

## 2022-07-26 ENCOUNTER — Encounter: Payer: Self-pay | Admitting: Family Medicine

## 2022-07-26 ENCOUNTER — Ambulatory Visit (INDEPENDENT_AMBULATORY_CARE_PROVIDER_SITE_OTHER): Payer: BC Managed Care – PPO | Admitting: Family Medicine

## 2022-07-26 ENCOUNTER — Encounter: Payer: Self-pay | Admitting: Gastroenterology

## 2022-07-26 VITALS — BP 114/78 | HR 57 | Temp 98.8°F | Resp 17 | Ht 69.0 in | Wt 175.0 lb

## 2022-07-26 DIAGNOSIS — Z0000 Encounter for general adult medical examination without abnormal findings: Secondary | ICD-10-CM | POA: Diagnosis not present

## 2022-07-26 DIAGNOSIS — Z23 Encounter for immunization: Secondary | ICD-10-CM | POA: Diagnosis not present

## 2022-07-26 DIAGNOSIS — E7800 Pure hypercholesterolemia, unspecified: Secondary | ICD-10-CM | POA: Diagnosis not present

## 2022-07-26 DIAGNOSIS — Z125 Encounter for screening for malignant neoplasm of prostate: Secondary | ICD-10-CM | POA: Diagnosis not present

## 2022-07-26 DIAGNOSIS — Z1211 Encounter for screening for malignant neoplasm of colon: Secondary | ICD-10-CM

## 2022-07-26 LAB — CBC WITH DIFFERENTIAL/PLATELET
Basophils Absolute: 0 10*3/uL (ref 0.0–0.1)
Basophils Relative: 0.6 % (ref 0.0–3.0)
Eosinophils Absolute: 0 10*3/uL (ref 0.0–0.7)
Eosinophils Relative: 0.6 % (ref 0.0–5.0)
HCT: 40.3 % (ref 39.0–52.0)
Hemoglobin: 13.5 g/dL (ref 13.0–17.0)
Lymphocytes Relative: 39.5 % (ref 12.0–46.0)
Lymphs Abs: 2.6 10*3/uL (ref 0.7–4.0)
MCHC: 33.6 g/dL (ref 30.0–36.0)
MCV: 96.1 fl (ref 78.0–100.0)
Monocytes Absolute: 0.5 10*3/uL (ref 0.1–1.0)
Monocytes Relative: 7 % (ref 3.0–12.0)
Neutro Abs: 3.4 10*3/uL (ref 1.4–7.7)
Neutrophils Relative %: 52.3 % (ref 43.0–77.0)
Platelets: 234 10*3/uL (ref 150.0–400.0)
RBC: 4.19 Mil/uL — ABNORMAL LOW (ref 4.22–5.81)
RDW: 12.5 % (ref 11.5–15.5)
WBC: 6.6 10*3/uL (ref 4.0–10.5)

## 2022-07-26 LAB — LIPID PANEL
Cholesterol: 212 mg/dL — ABNORMAL HIGH (ref 0–200)
HDL: 53 mg/dL (ref >39.00)
LDL Cholesterol: 141 mg/dL — ABNORMAL HIGH (ref 0–99)
NonHDL: 159.03
Total CHOL/HDL Ratio: 4
Triglycerides: 89 mg/dL (ref 0.0–149.0)
VLDL: 17.8 mg/dL (ref 0.0–40.0)

## 2022-07-26 LAB — BASIC METABOLIC PANEL
BUN: 24 mg/dL — ABNORMAL HIGH (ref 6–23)
CO2: 30 mEq/L (ref 19–32)
Calcium: 9.6 mg/dL (ref 8.4–10.5)
Chloride: 100 mEq/L (ref 96–112)
Creatinine, Ser: 1.01 mg/dL (ref 0.40–1.50)
GFR: 82.71 mL/min (ref >60.00)
Glucose, Bld: 81 mg/dL (ref 70–99)
Potassium: 4.4 mEq/L (ref 3.5–5.1)
Sodium: 138 mEq/L (ref 135–145)

## 2022-07-26 LAB — PSA: PSA: 1.96 ng/mL (ref 0.10–4.00)

## 2022-07-26 LAB — TSH: TSH: 1.29 u[IU]/mL (ref 0.35–5.50)

## 2022-07-26 LAB — HEPATIC FUNCTION PANEL
ALT: 9 U/L (ref 0–53)
AST: 15 U/L (ref 0–37)
Albumin: 4.3 g/dL (ref 3.5–5.2)
Alkaline Phosphatase: 42 U/L (ref 39–117)
Bilirubin, Direct: 0.1 mg/dL (ref 0.0–0.3)
Total Bilirubin: 0.4 mg/dL (ref 0.2–1.2)
Total Protein: 6.8 g/dL (ref 6.0–8.3)

## 2022-07-26 NOTE — Assessment & Plan Note (Signed)
Pt's PE WNL.  UTD on Tdap.  Due for colonoscopy- referral placed.  Check labs.  Anticipatory guidance provided.  ?

## 2022-07-26 NOTE — Progress Notes (Signed)
   Subjective:    Patient ID: Jason Browning, male    DOB: 14-May-1966, 57 y.o.   MRN: [id number]  HPI New to establish.  Last [place] OV 2019.  CPE- UTD on Tdap.  Due for shingles.  Due for colonoscopy.  No concerns today.   Review of Systems Patient reports no vision/hearing changes, anorexia, fever ,adenopathy, persistant/recurrent hoarseness, swallowing issues, chest pain, palpitations, edema, persistant/recurrent cough, hemoptysis, dyspnea (rest,exertional, paroxysmal nocturnal), gastrointestinal  bleeding (melena, rectal bleeding), abdominal pain, excessive heart burn, GU symptoms (dysuria, hematuria, voiding/incontinence issues) syncope, focal weakness, memory loss, numbness & tingling, skin/hair/nail changes, depression, anxiety, abnormal bruising/bleeding, musculoskeletal symptoms/signs.     Objective:   Physical Exam General Appearance:    Alert, cooperative, no distress, appears stated age  Head:    Normocephalic, without obvious abnormality, atraumatic  Eyes:    PERRL, conjunctiva/corneas clear, EOM's intact both eyes       Ears:    Normal TM's and external ear canals, both ears  Nose:   Nares normal, septum midline, mucosa normal, no drainage   or sinus tenderness  Throat:   Lips, mucosa, and tongue normal; teeth and gums normal  Neck:   Supple, symmetrical, trachea midline, no adenopathy;       thyroid:  No enlargement/tenderness/nodules  Back:     Symmetric, no curvature, ROM normal, no CVA tenderness  Lungs:     Clear to auscultation bilaterally, respirations unlabored  Chest wall:    No tenderness or deformity  Heart:    Regular rate and rhythm, S1 and S2 normal, no murmur, rub   or gallop  Abdomen:     Soft, non-tender, bowel sounds active all four quadrants,    no masses, no organomegaly  Genitalia:    Deferred   Rectal:    Extremities:   Extremities normal, atraumatic, no cyanosis or edema  Pulses:   2+ and symmetric all extremities  Skin:   Skin color, texture,  turgor normal, no rashes or lesions  Lymph nodes:   Cervical, supraclavicular, and axillary nodes normal  Neurologic:   CNII-XII intact. Normal strength, sensation and reflexes      throughout          Assessment & Plan:

## 2022-07-26 NOTE — Assessment & Plan Note (Signed)
Check PSA at pt's request

## 2022-07-26 NOTE — Patient Instructions (Signed)
Follow up in 1 year or as needed We'll notify you of your lab results and make any changes if needed Keep up the good work on healthy diet and regular exercise- you look great! We'll call you to schedule your colonoscopy consultation Call with any questions or concerns Stay Safe!  Stay Healthy! Welcome!  We're glad to have you!

## 2022-07-26 NOTE — Assessment & Plan Note (Signed)
Referral to GI placed

## 2022-07-27 ENCOUNTER — Telehealth: Payer: Self-pay

## 2022-07-27 NOTE — Telephone Encounter (Signed)
Pt seen results Via my chart  

## 2022-07-27 NOTE — Telephone Encounter (Signed)
-----   Message from Midge Minium, MD sent at 07/27/2022  7:32 AM EDT ----- Labs look great w/ exception of elevated LDL (bad cholesterol).  This will improve w/ healthy diet and regular exercise.  No need for meds at this time but we will follow.

## 2022-08-02 IMAGING — CT CT CARDIAC CORONARY ARTERY CALCIUM SCORE
3 series · 14 of 20 positions shown, 15 images · non-contrast
Comparison: None.
COMPARISON: None.

Addendum:
EXAM:
OVER-READ INTERPRETATION  CT CHEST

The following report is an over-read performed by radiologist Dr.
Klever Jumper [REDACTED] on 07/06/2020. This
over-read does not include interpretation of cardiac or coronary
anatomy or pathology. The coronary calcium score interpretation by
the cardiologist is attached.
CLINICAL DATA: Risk stratification
Coronary Calcium Score
TECHNIQUE: The patient was scanned on a Siemens Force scanner. Axial
non-contrast 3 mm slices were carried out through the heart. The
data set was analyzed on a dedicated work station and scored using
the Agatson method.

[Series 2: casc 3.0 bv41 2 bestdiast 71 % · axial · 0.42mm/px · z∈[-232,-160]mm · 4 of 40 slices shown, 5 images]
[im 8/40  vessel]
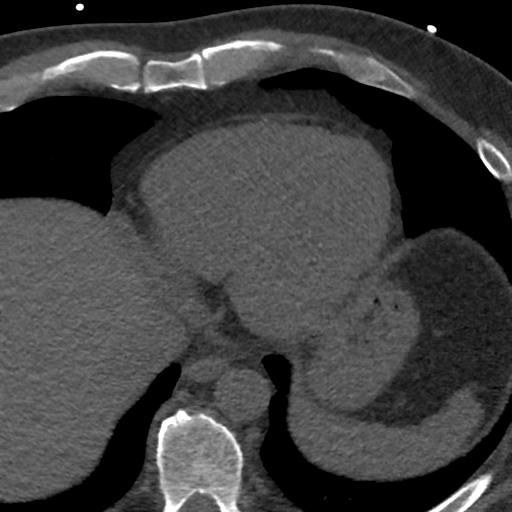
[im 8/40  lung]
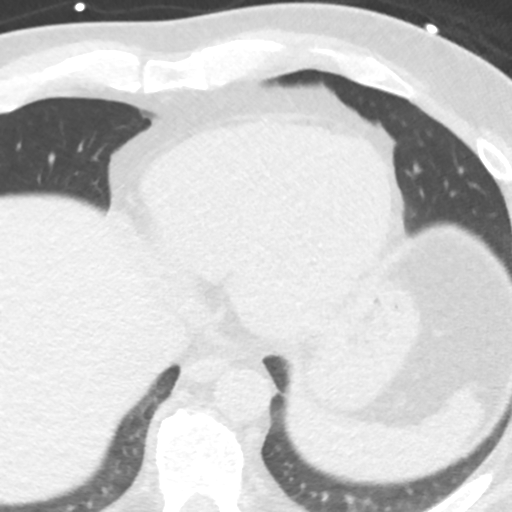
[im 16/40  vessel]
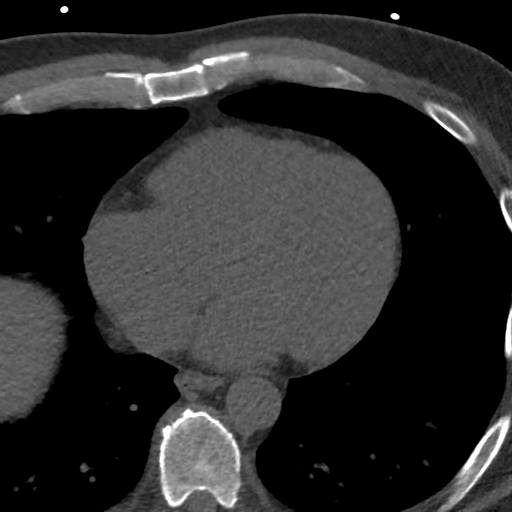
[im 24/40  vessel]
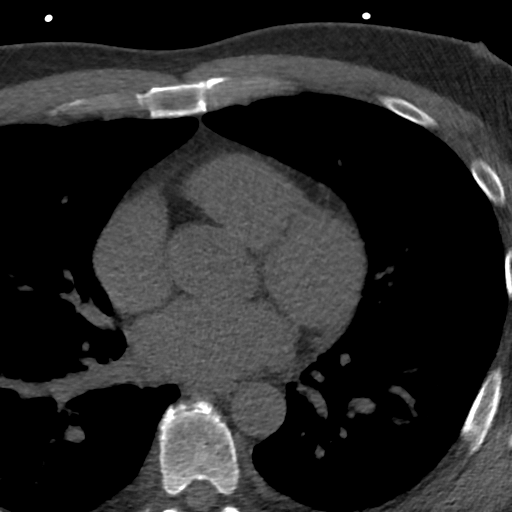
[im 32/40  vessel]
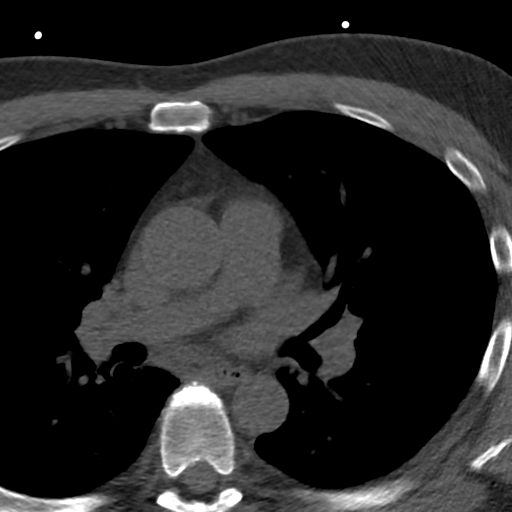

[Series 3: lung 71 % · axial · 0.71mm/px · z∈[-236,-158]mm · 5 of 40 slices shown]
[im 7/40  lung]
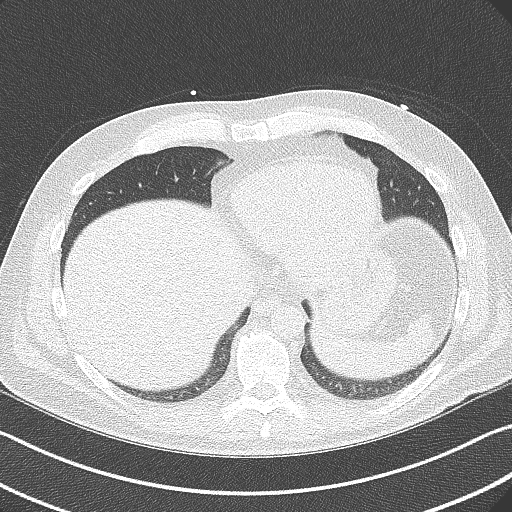
[im 14/40  lung]
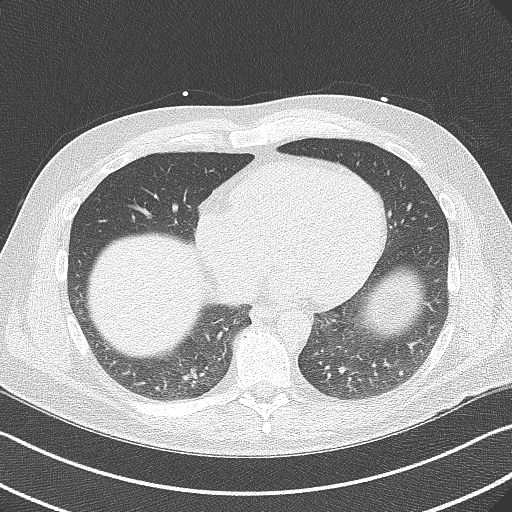
[im 20/40  lung]
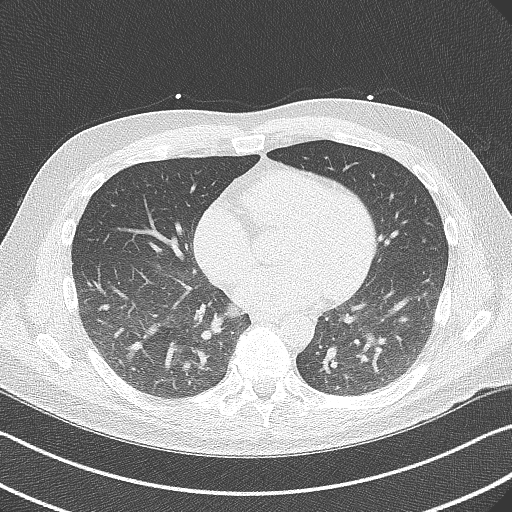
[im 27/40  lung]
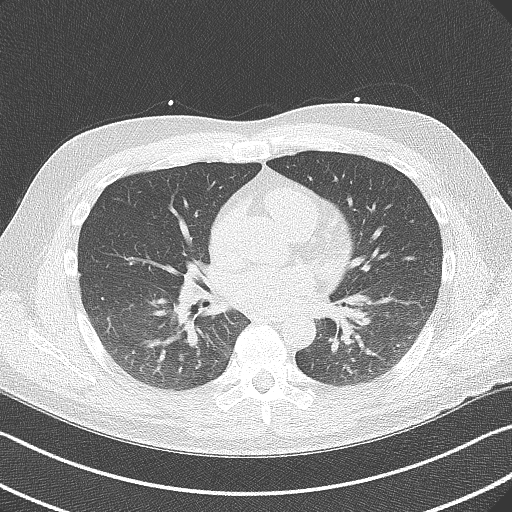
[im 33/40  lung]
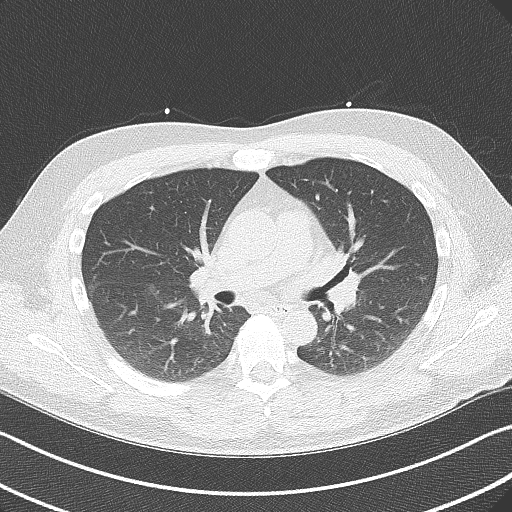

[Series 4: lung st 71 % · axial · 0.71mm/px · z∈[-236,-158]mm · 5 of 40 slices shown]
[im 7/40  lung]
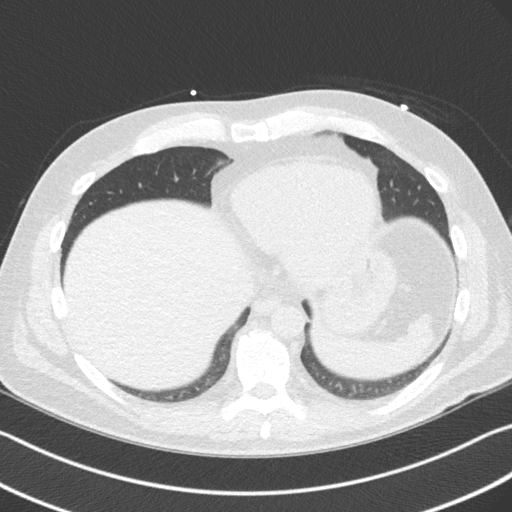
[im 14/40  lung]
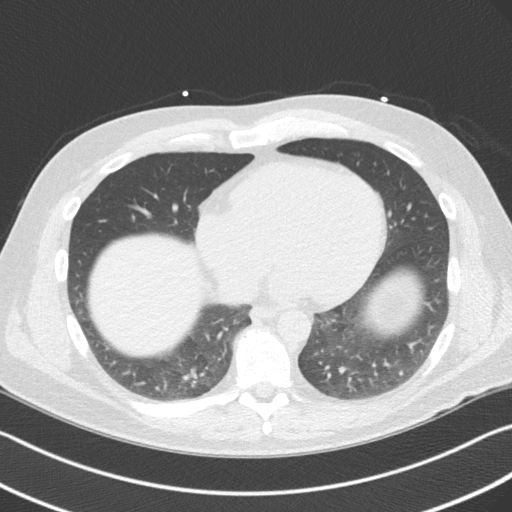
[im 20/40  lung]
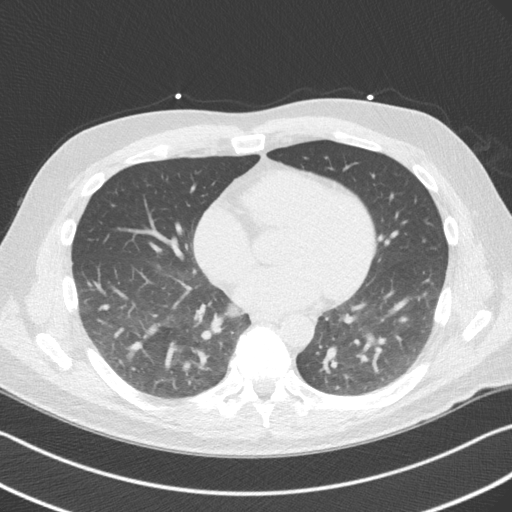
[im 27/40  lung]
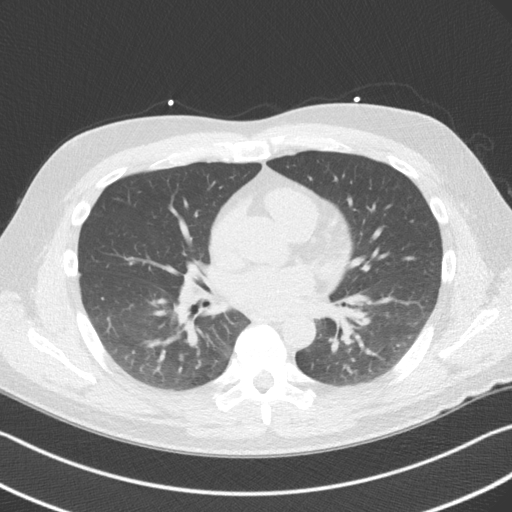
[im 33/40  lung]
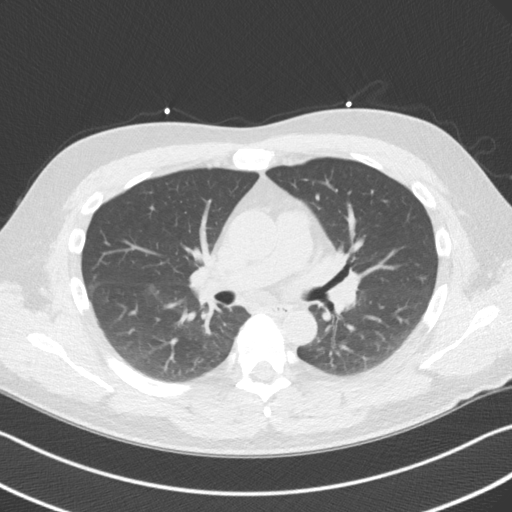

[14 of 20 positions shown; findings below may reference images not displayed]

FINDINGS: Within the visualized portions of the thorax there are no suspicious
appearing pulmonary nodules or masses, there is no acute
consolidative airspace disease, no pleural effusions, no
pneumothorax and no lymphadenopathy. Visualized portions of the
upper abdomen are unremarkable. There are no aggressive appearing
lytic or blastic lesions noted in the visualized portions of the
skeleton.
IMPRESSION: No significant incidental noncardiac findings are noted.
FINDINGS: Coronary arteries: Normal origins.

Coronary Calcium Score:

Left main: 0

Left anterior descending artery:

Left circumflex artery: 0

Right coronary artery: 0

Total:

Percentile: 43rd

Pericardium: Normal.

Ascending Aorta: Normal caliber.

Non-cardiac: See separate report from [REDACTED].
IMPRESSION: Coronary calcium score of 0.7. This was 43rd percentile for age and
sex matched controls.

*** End of Addendum ***
EXAM:
OVER-READ INTERPRETATION  CT CHEST

The following report is an over-read performed by radiologist Dr.
Klever Jumper [REDACTED] on 07/06/2020. This
over-read does not include interpretation of cardiac or coronary
anatomy or pathology. The coronary calcium score interpretation by
the cardiologist is attached.
FINDINGS: Within the visualized portions of the thorax there are no suspicious
appearing pulmonary nodules or masses, there is no acute
consolidative airspace disease, no pleural effusions, no
pneumothorax and no lymphadenopathy. Visualized portions of the
upper abdomen are unremarkable. There are no aggressive appearing
lytic or blastic lesions noted in the visualized portions of the
skeleton.
IMPRESSION: No significant incidental noncardiac findings are noted.

## 2022-08-31 ENCOUNTER — Encounter: Payer: Self-pay | Admitting: Gastroenterology

## 2022-08-31 ENCOUNTER — Ambulatory Visit (AMBULATORY_SURGERY_CENTER): Payer: BC Managed Care – PPO | Admitting: *Deleted

## 2022-08-31 VITALS — Ht 69.0 in | Wt 175.0 lb

## 2022-08-31 DIAGNOSIS — Z1211 Encounter for screening for malignant neoplasm of colon: Secondary | ICD-10-CM

## 2022-08-31 MED ORDER — NA SULFATE-K SULFATE-MG SULF 17.5-3.13-1.6 GM/177ML PO SOLN: 1.0000 | Freq: Once | ORAL | 0 refills | Status: AC

## 2022-08-31 NOTE — Progress Notes (Addendum)
Pt's name and DOB verified at the beginning of the pre-visit.  Pt denies any difficulty with ambulating.  No egg or soy allergy known to patient  No issues known to pt with past sedation with any surgeries or procedures Pt denies having issues being intubated Pt has no issues moving head neck or swallowing No FH of Malignant Hyperthermia Pt is not on diet pills Pt is not on home 02  Pt is not on blood thinners  Pt denies issues with constipation  Pt is not on dialysis Pt denies any upcoming cardiac testing Pt encouraged to use to use Singlecare or Goodrx to reduce cost  Patient's chart reviewed by Cathlyn Parsons CNRA prior to pre-visit and patient appropriate for the [facility].  Pre-visit completed and red dot placed by patient's name on their procedure day (on provider's schedule).  . Visit by phone Pt states weight is 175lb Pt instructed to call if has any changes in health or new medications. Pt states they will. Instructions reviewed with pt and pt states understanding. Instructed to review again prior to procedure. Pt states they will.  Instructions sent by mail with coupon and by my chart

## 2022-09-14 ENCOUNTER — Ambulatory Visit (AMBULATORY_SURGERY_CENTER): Payer: BC Managed Care – PPO | Admitting: Gastroenterology

## 2022-09-14 ENCOUNTER — Encounter: Payer: Self-pay | Admitting: Gastroenterology

## 2022-09-14 VITALS — BP 126/66 | HR 62 | Temp 98.4°F | Resp 13 | Ht 69.0 in | Wt 175.0 lb

## 2022-09-14 DIAGNOSIS — D122 Benign neoplasm of ascending colon: Secondary | ICD-10-CM | POA: Diagnosis not present

## 2022-09-14 DIAGNOSIS — D120 Benign neoplasm of cecum: Secondary | ICD-10-CM

## 2022-09-14 DIAGNOSIS — D128 Benign neoplasm of rectum: Secondary | ICD-10-CM | POA: Diagnosis not present

## 2022-09-14 DIAGNOSIS — Z1211 Encounter for screening for malignant neoplasm of colon: Secondary | ICD-10-CM

## 2022-09-14 DIAGNOSIS — K621 Rectal polyp: Secondary | ICD-10-CM | POA: Diagnosis not present

## 2022-09-14 MED ORDER — SODIUM CHLORIDE 0.9 % IV SOLN: 500.0000 mL | INTRAVENOUS | Status: AC

## 2022-09-14 NOTE — Progress Notes (Signed)
Pt's states no medical or surgical changes since previsit or office visit. 

## 2022-09-14 NOTE — Patient Instructions (Signed)
YOU HAD AN ENDOSCOPIC PROCEDURE TODAY AT THE  ENDOSCOPY CENTER:   Refer to the procedure report that was given to you for any specific questions about what was found during the examination.  If the procedure report does not answer your questions, please call your gastroenterologist to clarify.  If you requested that your care partner not be given the details of your procedure findings, then the procedure report has been included in a sealed envelope for you to review at your convenience later.  YOU SHOULD EXPECT: Some feelings of bloating in the abdomen. Passage of more gas than usual.  Walking can help get rid of the air that was put into your GI tract during the procedure and reduce the bloating. If you had a lower endoscopy (such as a colonoscopy or flexible sigmoidoscopy) you may notice spotting of blood in your stool or on the toilet paper. If you underwent a bowel prep for your procedure, you may not have a normal bowel movement for a few days.  Please Note:  You might notice some irritation and congestion in your nose or some drainage.  This is from the oxygen used during your procedure.  There is no need for concern and it should clear up in a day or so.  SYMPTOMS TO REPORT IMMEDIATELY:  Following lower endoscopy (colonoscopy or flexible sigmoidoscopy):  Excessive amounts of blood in the stool  Significant tenderness or worsening of abdominal pains  Swelling of the abdomen that is new, acute  Fever of 100F or higher   For urgent or emergent issues, a gastroenterologist can be reached at any hour by calling [phone]. Do not use MyChart messaging for urgent concerns.    DIET:  We do recommend a small meal at first, but then you may proceed to your regular diet.  Drink plenty of fluids but you should avoid alcoholic beverages for 24 hours.  MEDICATIONS: Continue present medications.  FOLLOW UP: Repeat colonoscopy for surveillance based on the pathology results. Date will be  determined once Dr. Tomasa Rand has reviewed your pathology results.  Please see handouts given to you by your recovery nurse.  Thank you for allowing [place] to provide for your healthcare needs today.  ACTIVITY:  You should plan to take it easy for the rest of today and you should NOT DRIVE or use heavy machinery until tomorrow (because of the sedation medicines used during the test).    FOLLOW UP: Our staff will call the number listed on your records the next business day following your procedure.  We will call around 7:15- 8:00 am to check on you and address any questions or concerns that you may have regarding the information given to you following your procedure. If we do not reach you, we will leave a message.     If any biopsies were taken you will be contacted by phone or by letter within the next 1-3 weeks.  Please call us at [phone] if you have not heard about the biopsies in 3 weeks.    SIGNATURES/CONFIDENTIALITY: You and/or your care partner have signed paperwork which will be entered into your electronic medical record.  These signatures attest to the fact that that the information above on your After Visit Summary has been reviewed and is understood.  Full responsibility of the confidentiality of this discharge information lies with you and/or your care-partner.

## 2022-09-14 NOTE — Progress Notes (Signed)
[facility] History and Physical   Primary Care Physician:  Sheliah Hatch, MD   Reason for Procedure:   Colon cancer screening  Plan:    Screening colonoscopy     HPI: Jason Browning is a 57 y.o. male undergoing initial average risk screening colonoscopy.  He has no family history of colon cancer and no chronic GI symptoms.    Past Medical History:  Diagnosis Date   Clotting disorder (HCC)    Hx of blood clots    after surgical procedure. no other issues.     Past Surgical History:  Procedure Laterality Date   LEG SURGERY Left    broken femur, blood clot after surgery    Prior to Admission medications   Not on File    No current outpatient medications on file.   No current facility-administered medications for this visit.    Allergies as of 09/14/2022   (No Known Allergies)    Family History  Problem Relation Age of Onset   Lung cancer Maternal Aunt 79       + extensive smoking history   Ehlers-Danlos syndrome Daughter    Colon cancer Neg Hx    Colon polyps Neg Hx    Esophageal cancer Neg Hx    Rectal cancer Neg Hx    Stomach cancer Neg Hx     Social History   Socioeconomic History   Marital status: Married    Spouse name: Not on file   Number of children: 1   Years of education: Not on file   Highest education level: Not on file  Occupational History   Not on file  Tobacco Use   Smoking status: Never   Smokeless tobacco: Current    Types: Chew  Vaping Use   Vaping Use: Never used  Substance and Sexual Activity   Alcohol use: Yes    Comment: rarely   Drug use: No   Sexual activity: Yes  Other Topics Concern   Not on file  Social History Narrative   Not on file   Social Determinants of Health   Financial Resource Strain: Not on file  Food Insecurity: Not on file  Transportation Needs: Not on file  Physical Activity: Sufficiently Active (05/21/2017)   Exercise Vital Sign    Days of Exercise per Week: 4 days     Minutes of Exercise per Session: 60 min  Stress: Not on file  Social Connections: Not on file  Intimate Partner Violence: Not on file    Review of Systems:  All other review of systems negative except as mentioned in the HPI.  Physical Exam: Vital signs There were no vitals taken for this visit.  General:   Alert,  Well-developed, well-nourished, pleasant and cooperative in NAD Airway:  Mallampati 2 Lungs:  Clear throughout to auscultation.   Heart:  Regular rate and rhythm; no murmurs, clicks, rubs,  or gallops. Abdomen:  Soft, nontender and nondistended. Normal bowel sounds.   Neuro/Psych:  Normal mood and affect. A and O x 3   Ellawyn Wogan E. Tomasa Rand, MD [facility]

## 2022-09-14 NOTE — Op Note (Signed)
[facility] Patient Name: Atwell Russello Procedure Date: 09/14/2022 2:23 PM MRN: [id number] Endoscopist: Lorin Picket E. Tomasa Rand , MD, [id number] Age: 57 Referring MD:  Date of Birth: 1966-02-18 Gender: Male Account #: [id number] Procedure:                Colonoscopy Indications:              Screening for colorectal malignant neoplasm, This                            is the patient's first colonoscopy Medicines:                Monitored Anesthesia Care Procedure:                Pre-Anesthesia Assessment:                           - Prior to the procedure, a History and Physical                            was performed, and patient medications and                            allergies were reviewed. The patient's tolerance of                            previous anesthesia was also reviewed. The risks                            and benefits of the procedure and the sedation                            options and risks were discussed with the patient.                            All questions were answered, and informed consent                            was obtained. Prior Anticoagulants: The patient has                            taken no anticoagulant or antiplatelet agents. ASA                            Grade Assessment: II - A patient with mild systemic                            disease. After reviewing the risks and benefits,                            the patient was deemed in satisfactory condition to                            undergo the procedure.  After obtaining informed consent, the colonoscope                            was passed under direct vision. Throughout the                            procedure, the patient's blood pressure, pulse, and                            oxygen saturations were monitored continuously. The                            Olympus SN [id number] was introduced through the anus                            and advanced to  the the terminal ileum, with                            identification of the appendiceal orifice and IC                            valve. The colonoscopy was somewhat difficult due                            to a redundant colon and a tortuous colon.                            Successful completion of the procedure was aided by                            using manual pressure and withdrawing and                            reinserting the scope. The patient tolerated the                            procedure well. The quality of the bowel                            preparation was adequate. The terminal ileum,                            ileocecal valve, appendiceal orifice, and rectum                            were photographed. The bowel preparation used was                            SUPREP via split dose instruction. Scope In: 2:30:47 PM Scope Out: 3:05:20 PM Scope Withdrawal Time: 0 hours 22 minutes 3 seconds  Total Procedure Duration: 0 hours 34 minutes 33 seconds  Findings:                 The perianal and digital rectal examinations  were                            normal. Pertinent negatives include normal                            sphincter tone and no palpable rectal lesions.                           A 3 mm polyp was found in the cecum. The polyp was                            flat. The polyp was removed with a cold snare.                            Resection and retrieval were complete. Estimated                            blood loss was minimal.                           A 1 mm polyp was found in the ascending colon. The                            polyp was sessile. The polyp was removed with a                            cold biopsy forceps. Resection and retrieval were                            complete. Estimated blood loss was minimal.                           Two flat and sessile polyps were found in the                            ascending colon. The polyps were 5 to 6  mm in size.                            These polyps were removed with a cold snare.                            Resection and retrieval were complete. Estimated                            blood loss was minimal.                           A 3 mm polyp was found in the rectum. The polyp was                            sessile. The polyp was removed with a cold snare.  Resection and retrieval were complete. Estimated                            blood loss was minimal.                           The exam was otherwise normal throughout the                            examined colon.                           The terminal ileum appeared normal.                           The retroflexed view of the distal rectum and anal                            verge was normal and showed no anal or rectal                            abnormalities. Complications:            No immediate complications. Estimated Blood Loss:     Estimated blood loss was minimal. Impression:               - One 3 mm polyp in the cecum, removed with a cold                            snare. Resected and retrieved.                           - One 1 mm polyp in the ascending colon, removed                            with a cold biopsy forceps. Resected and retrieved.                           - Two 5 to 6 mm polyps in the ascending colon,                            removed with a cold snare. Resected and retrieved.                           - One 3 mm polyp in the rectum, removed with a cold                            snare. Resected and retrieved.                           - The examined portion of the ileum was normal.                           - The distal rectum and anal verge are normal on  retroflexion view. Recommendation:           - Patient has a contact number available for                            emergencies. The signs and symptoms of potential                             delayed complications were discussed with the                            patient. Return to normal activities tomorrow.                            Written discharge instructions were provided to the                            patient.                           - Resume previous diet.                           - Continue present medications.                           - Await pathology results.                           - Repeat colonoscopy (date not yet determined) for                            surveillance based on pathology results. Mena Lienau E. Tomasa Rand, MD 09/14/2022 3:12:34 PM This report has been signed electronically.

## 2022-09-14 NOTE — Progress Notes (Signed)
Vss nad trans to pacu 

## 2022-09-14 NOTE — Progress Notes (Signed)
Called to room to assist during endoscopic procedure.  Patient ID and intended procedure confirmed with present staff. Received instructions for my participation in the procedure from the performing physician.  

## 2022-09-17 ENCOUNTER — Telehealth: Payer: Self-pay

## 2022-09-17 NOTE — Telephone Encounter (Signed)
  Follow up Call-     09/14/2022    2:22 PM  Call back number  Post procedure Call Back phone  # [phone]  Permission to leave phone message Yes     Patient questions:  Do you have a fever, pain , or abdominal swelling? No. Pain Score  0 *  Have you tolerated food without any problems? Yes.    Have you been able to return to your normal activities? Yes.    Do you have any questions about your discharge instructions: Diet   No. Medications  No. Follow up visit  No.  Do you have questions or concerns about your Care? No.  Actions: * If pain score is 4 or above: No action needed, pain <4.

## 2022-09-19 NOTE — Progress Notes (Signed)
Mr. Jason Browning,  Only two of the five polyps which I removed during your recent procedure were proven to be "pre-cancerous" polyps that MAY have grown into cancer if they had not been removed.  The other polyps were completely benign with no precancerous changes.  Studies shows that at least 20% of women over age 58 and 30% of men over age 70 have pre-cancerous polyps.  Based on current nationally recognized surveillance guidelines, I recommend that you have a repeat colonoscopy in 7 years.   If you develop any new rectal bleeding, abdominal pain or significant bowel habit changes, please contact me before then.

## 2023-05-23 DIAGNOSIS — M25551 Pain in right hip: Secondary | ICD-10-CM | POA: Diagnosis not present

## 2023-06-12 DIAGNOSIS — M1611 Unilateral primary osteoarthritis, right hip: Secondary | ICD-10-CM | POA: Diagnosis not present

## 2023-11-29 ENCOUNTER — Encounter: Admitting: Family Medicine

## 2023-12-30 ENCOUNTER — Encounter: Admitting: Family Medicine

## 2024-02-19 ENCOUNTER — Ambulatory Visit (INDEPENDENT_AMBULATORY_CARE_PROVIDER_SITE_OTHER): Payer: Self-pay | Admitting: Family Medicine

## 2024-02-19 VITALS — BP 120/70 | HR 69 | Temp 98.0°F | Ht 68.0 in | Wt 203.8 lb

## 2024-02-19 DIAGNOSIS — Z0000 Encounter for general adult medical examination without abnormal findings: Secondary | ICD-10-CM

## 2024-02-19 DIAGNOSIS — E669 Obesity, unspecified: Secondary | ICD-10-CM | POA: Diagnosis not present

## 2024-02-19 DIAGNOSIS — Z114 Encounter for screening for human immunodeficiency virus [HIV]: Secondary | ICD-10-CM | POA: Diagnosis not present

## 2024-02-19 DIAGNOSIS — Z125 Encounter for screening for malignant neoplasm of prostate: Secondary | ICD-10-CM

## 2024-02-19 DIAGNOSIS — Z1159 Encounter for screening for other viral diseases: Secondary | ICD-10-CM

## 2024-02-19 DIAGNOSIS — N529 Male erectile dysfunction, unspecified: Secondary | ICD-10-CM | POA: Diagnosis not present

## 2024-02-19 NOTE — Assessment & Plan Note (Signed)
 Pt's PE WNL w/ exception of BMI.  Declines vaccines.  UTD on colonoscopy.  Check labs.  Anticipatory guidance provided.

## 2024-02-19 NOTE — Progress Notes (Signed)
   Subjective:    Patient ID: Jason Browning, male    DOB: 08/31/1965, 58 y.o.   MRN: [id number]  HPI CPE- UTD on colonoscopy.  Declines flu, Tdap, PNA.  Patient Care Team    Relationship Specialty Notifications Start End  Mahlon Comer BRAVO, MD PCP - General Family Medicine  06/03/20      Health Maintenance  Topic Date Due   HIV Screening  Never done   Hepatitis C Screening  Never done   Hepatitis B Vaccines 19-59 Average Risk (1 of 3 - 19+ 3-dose series) Never done   Pneumococcal Vaccine: 50+ Years (1 of 1 - PCV) Never done   Zoster Vaccines- Shingrix (1 of 2) 04/29/2015   DTaP/Tdap/Td (2 - Td or Tdap) 10/24/2023   Influenza Vaccine  08/11/2024 (Originally 12/13/2023)   Colonoscopy  09/13/2029   HPV VACCINES  Aged Out   Meningococcal B Vaccine  Aged Out   COVID-19 Vaccine  Discontinued     Review of Systems Patient reports no vision/hearing changes, anorexia, fever ,adenopathy, persistant/recurrent hoarseness, swallowing issues, chest pain, palpitations, edema, persistant/recurrent cough, hemoptysis, dyspnea (rest,exertional, paroxysmal nocturnal), gastrointestinal  bleeding (melena, rectal bleeding), abdominal pain, excessive heart burn, GU symptoms (dysuria, hematuria, voiding/incontinence issues) syncope, focal weakness, memory loss, numbness & tingling, skin/hair/nail changes, depression, anxiety, abnormal bruising/bleeding, musculoskeletal symptoms/signs.   + 28 lb weight gain + ED    Objective:   Physical Exam General Appearance:    Alert, cooperative, no distress, appears stated age  Head:    Normocephalic, without obvious abnormality, atraumatic  Eyes:    PERRL, conjunctiva/corneas clear, EOM's intact both eyes       Ears:    Normal TM's and external ear canals, both ears  Nose:   Nares normal, septum midline, mucosa normal, no drainage   or sinus tenderness  Throat:   Lips, mucosa, and tongue normal; teeth and gums normal  Neck:   Supple, symmetrical, trachea  midline, no adenopathy;       thyroid :  No enlargement/tenderness/nodules  Back:     Symmetric, no curvature, ROM normal, no CVA tenderness  Lungs:     Clear to auscultation bilaterally, respirations unlabored  Chest wall:    No tenderness or deformity  Heart:    Regular rate and rhythm, S1 and S2 normal, no murmur, rub   or gallop  Abdomen:     Soft, non-tender, bowel sounds active all four quadrants,    no masses, no organomegaly  Genitalia:    deferred  Rectal:    Extremities:   Extremities normal, atraumatic, no cyanosis or edema  Pulses:   2+ and symmetric all extremities  Skin:   Skin color, texture, turgor normal, no rashes or lesions  Lymph nodes:   Cervical, supraclavicular, and axillary nodes normal  Neurologic:   CNII-XII intact. Normal strength, sensation and reflexes      throughout          Assessment & Plan:

## 2024-02-19 NOTE — Patient Instructions (Signed)
Follow up in 1 year or as needed We'll notify you of your lab results and make any changes if needed Continue to work on healthy diet and regular exercise- you look great! Call with any questions or concerns Stay Safe!  Stay Healthy! Happy Fall!!! 

## 2024-02-20 LAB — BASIC METABOLIC PANEL WITH GFR
BUN: 17 mg/dL (ref 6–23)
CO2: 30 meq/L (ref 19–32)
Calcium: 9.3 mg/dL (ref 8.4–10.5)
Chloride: 103 meq/L (ref 96–112)
Creatinine, Ser: 0.95 mg/dL (ref 0.40–1.50)
GFR: 88.04 mL/min (ref >60.00)
Glucose, Bld: 91 mg/dL (ref 70–99)
Potassium: 4.9 meq/L (ref 3.5–5.1)
Sodium: 140 meq/L (ref 135–145)

## 2024-02-20 LAB — CBC WITH DIFFERENTIAL/PLATELET
Basophils Absolute: 0.1 K/uL (ref 0.0–0.1)
Basophils Relative: 0.9 % (ref 0.0–3.0)
Eosinophils Absolute: 0.1 K/uL (ref 0.0–0.7)
Eosinophils Relative: 1.7 % (ref 0.0–5.0)
HCT: 42.5 % (ref 39.0–52.0)
Hemoglobin: 14.1 g/dL (ref 13.0–17.0)
Lymphocytes Relative: 28.9 % (ref 12.0–46.0)
Lymphs Abs: 1.9 K/uL (ref 0.7–4.0)
MCHC: 33.1 g/dL (ref 30.0–36.0)
MCV: 97 fl (ref 78.0–100.0)
Monocytes Absolute: 0.4 K/uL (ref 0.1–1.0)
Monocytes Relative: 6.6 % (ref 3.0–12.0)
Neutro Abs: 4.1 K/uL (ref 1.4–7.7)
Neutrophils Relative %: 61.9 % (ref 43.0–77.0)
Platelets: 224 K/uL (ref 150.0–400.0)
RBC: 4.38 Mil/uL (ref 4.22–5.81)
RDW: 12.2 % (ref 11.5–15.5)
WBC: 6.7 K/uL (ref 4.0–10.5)

## 2024-02-20 LAB — LIPID PANEL
Cholesterol: 223 mg/dL — ABNORMAL HIGH (ref 0–200)
HDL: 46.6 mg/dL (ref >39.00)
LDL Cholesterol: 148 mg/dL — ABNORMAL HIGH (ref 0–99)
NonHDL: 176.23
Total CHOL/HDL Ratio: 5
Triglycerides: 143 mg/dL (ref 0.0–149.0)
VLDL: 28.6 mg/dL (ref 0.0–40.0)

## 2024-02-20 LAB — HEPATIC FUNCTION PANEL
ALT: 16 U/L (ref 0–53)
AST: 21 U/L (ref 0–37)
Albumin: 4.5 g/dL (ref 3.5–5.2)
Alkaline Phosphatase: 53 U/L (ref 39–117)
Bilirubin, Direct: 0.1 mg/dL (ref 0.0–0.3)
Total Bilirubin: 0.5 mg/dL (ref 0.2–1.2)
Total Protein: 6.9 g/dL (ref 6.0–8.3)

## 2024-02-20 LAB — PSA: PSA: 2.58 ng/mL (ref 0.10–4.00)

## 2024-02-20 LAB — HEPATITIS C ANTIBODY: Hepatitis C Ab: NONREACTIVE

## 2024-02-20 LAB — TSH: TSH: 1.16 u[IU]/mL (ref 0.35–5.50)

## 2024-02-20 LAB — HIV ANTIBODY (ROUTINE TESTING W REFLEX)
HIV 1&2 Ab, 4th Generation: NONREACTIVE
HIV FINAL INTERPRETATION: NEGATIVE

## 2024-02-20 LAB — TESTOSTERONE: Testosterone: 346.83 ng/dL (ref 300.00–890.00)

## 2024-02-21 ENCOUNTER — Ambulatory Visit: Payer: Self-pay | Admitting: Family Medicine

## 2024-02-24 NOTE — Progress Notes (Signed)
 Pt has reviewed via MyChart
